# Patient Record
Sex: Female | Born: 1980 | Race: White | Hispanic: No | Marital: Married | Smoking: Current every day smoker
Health system: Southern US, Community
[De-identification: ages and names within clinical notes are randomized; demographics above are authoritative.]

## PROBLEM LIST (undated history)

## (undated) DIAGNOSIS — B1920 Unspecified viral hepatitis C without hepatic coma: Secondary | ICD-10-CM

## (undated) HISTORY — PX: CYSTECTOMY: SUR359

---

## 2005-06-08 ENCOUNTER — Emergency Department (HOSPITAL_COMMUNITY): Admission: EM | Admit: 2005-06-08 | Discharge: 2005-06-08 | Payer: Self-pay | Admitting: Emergency Medicine

## 2007-05-03 ENCOUNTER — Emergency Department (HOSPITAL_COMMUNITY): Admission: EM | Admit: 2007-05-03 | Discharge: 2007-05-03 | Payer: Self-pay | Admitting: Emergency Medicine

## 2009-04-29 ENCOUNTER — Emergency Department (HOSPITAL_COMMUNITY): Admission: EM | Admit: 2009-04-29 | Discharge: 2009-04-29 | Payer: Self-pay | Admitting: Emergency Medicine

## 2010-05-04 ENCOUNTER — Emergency Department (HOSPITAL_COMMUNITY)
Admission: EM | Admit: 2010-05-04 | Discharge: 2010-05-04 | Disposition: A | Discharge status: Home / Self Care | Payer: Medicaid Other | Attending: Emergency Medicine | Admitting: Emergency Medicine

## 2010-05-04 DIAGNOSIS — K089 Disorder of teeth and supporting structures, unspecified: Secondary | ICD-10-CM | POA: Insufficient documentation

## 2010-05-04 DIAGNOSIS — G43909 Migraine, unspecified, not intractable, without status migrainosus: Secondary | ICD-10-CM | POA: Insufficient documentation

## 2010-05-04 DIAGNOSIS — B850 Pediculosis due to Pediculus humanus capitis: Secondary | ICD-10-CM | POA: Insufficient documentation

## 2011-12-06 ENCOUNTER — Other Ambulatory Visit: Payer: Self-pay

## 2011-12-10 ENCOUNTER — Other Ambulatory Visit (HOSPITAL_COMMUNITY): Payer: Self-pay | Admitting: Specialist

## 2011-12-10 DIAGNOSIS — Z3689 Encounter for other specified antenatal screening: Secondary | ICD-10-CM

## 2011-12-12 ENCOUNTER — Encounter (HOSPITAL_COMMUNITY): Payer: Self-pay | Admitting: Specialist

## 2011-12-18 ENCOUNTER — Ambulatory Visit (HOSPITAL_COMMUNITY)
Admission: RE | Admit: 2011-12-18 | Discharge: 2011-12-18 | Disposition: A | Discharge status: Home / Self Care | Payer: Medicaid Other | Source: Ambulatory Visit | Attending: Specialist | Admitting: Specialist

## 2011-12-18 ENCOUNTER — Encounter (HOSPITAL_COMMUNITY): Payer: Self-pay

## 2011-12-18 DIAGNOSIS — O358XX0 Maternal care for other (suspected) fetal abnormality and damage, not applicable or unspecified: Secondary | ICD-10-CM | POA: Insufficient documentation

## 2011-12-18 DIAGNOSIS — F1010 Alcohol abuse, uncomplicated: Secondary | ICD-10-CM | POA: Insufficient documentation

## 2011-12-18 DIAGNOSIS — F1920 Other psychoactive substance dependence, uncomplicated: Secondary | ICD-10-CM | POA: Insufficient documentation

## 2011-12-18 DIAGNOSIS — F1120 Opioid dependence, uncomplicated: Secondary | ICD-10-CM | POA: Insufficient documentation

## 2011-12-18 DIAGNOSIS — O99310 Alcohol use complicating pregnancy, unspecified trimester: Secondary | ICD-10-CM

## 2011-12-18 DIAGNOSIS — Z3689 Encounter for other specified antenatal screening: Secondary | ICD-10-CM

## 2011-12-18 DIAGNOSIS — O99320 Drug use complicating pregnancy, unspecified trimester: Secondary | ICD-10-CM | POA: Insufficient documentation

## 2011-12-18 DIAGNOSIS — F1410 Cocaine abuse, uncomplicated: Secondary | ICD-10-CM | POA: Insufficient documentation

## 2011-12-18 DIAGNOSIS — O99330 Smoking (tobacco) complicating pregnancy, unspecified trimester: Secondary | ICD-10-CM | POA: Insufficient documentation

## 2011-12-18 DIAGNOSIS — O98519 Other viral diseases complicating pregnancy, unspecified trimester: Secondary | ICD-10-CM | POA: Insufficient documentation

## 2011-12-18 DIAGNOSIS — B182 Chronic viral hepatitis C: Secondary | ICD-10-CM | POA: Insufficient documentation

## 2011-12-18 DIAGNOSIS — Z1389 Encounter for screening for other disorder: Secondary | ICD-10-CM | POA: Insufficient documentation

## 2011-12-18 DIAGNOSIS — O98419 Viral hepatitis complicating pregnancy, unspecified trimester: Secondary | ICD-10-CM

## 2011-12-18 DIAGNOSIS — Z363 Encounter for antenatal screening for malformations: Secondary | ICD-10-CM | POA: Insufficient documentation

## 2011-12-18 DIAGNOSIS — B1710 Acute hepatitis C without hepatic coma: Secondary | ICD-10-CM | POA: Insufficient documentation

## 2011-12-18 NOTE — Consult Note (Signed)
MATERNAL FETAL MEDICINE CONSULT  Patient Name: Jill Collier Medical Record Number:  [id number] Date of Birth: 06/30/1980 Requesting Physician Name:  Kent Hjerpe, MD Date of Service: 12/18/2011  Chief Complaint Polysubstance abuse; Hepatitis C  History of Present Illness Jill Collier was seen today for prenatal diagnosis secondary to polysubstance abuse and hepatitis C at the request of Dr. Kent Hjerpe.  The patient is a 31 y.o. G3P2002,at [redacted]w[redacted]d with an EDD of 05/08/2012, by Last Menstrual Period dating method.  Jill Collier reports a prior history of heroine use for which she has been on suboxone maintenance therapy x 1 year.  She reports taking 8 mg TID.  She reports recreational cocaine use with last use 2 weeks ago.  She reports a long history of tobacco abuse and was smoking 1-1/1/2 ppd prior to pregnancy but has decreased to 1/2 ppd at current.  She also was diagnosed this pregnancy with Hepatitis C.  Her husband was recently diagnosed with Hepatitis C in prison. She denies any complaints aside from nausea and fatigue.   Review of Systems Pertinent items are noted in HPI.   Patient History OB History    Grav Para Term Preterm Abortions TAB SAB Ect Mult Living   3 2 2 0 0 0 0 0 0 2     # Outc Date GA Lbr Len/2nd Wgt Sex Del Anes PTL Lv   1 TRM            2 TRM            3 CUR               History reviewed. No pertinent past medical history.  History reviewed. No pertinent past surgical history.  History   Social History  . Marital Status: Married    Spouse Name: N/A    Number of Children: N/A  . Years of Education: N/A   Social History Main Topics  . Smoking status: Current Every Day Smoker -- 0.5 packs/day    Types: Cigarettes  . Smokeless tobacco: Never Used  . Alcohol Use:   . Drug Use: Yes    Special: Cocaine, Heroin  . Sexually Active:    Other Topics Concern  . None   Social History Narrative  . None    History reviewed. No pertinent family history. In  addition, the patient has no family history of mental retardation, birth defects, or genetic diseases.  Physical Examination General appearance - alert, well appearing, and in no distress  Assessment and Recommendations Jill Collier is a 31 yo G3P2002 @ [redacted]w[redacted]d with polysubstance abuse and hepatitis C.    Tobacco use:  Cessation was advised and discussed with patient. She is trying to "cut down". Effects of smoking on fetus and pregnancy were discussed with patient including low birth weight, growth restriction, placental abruption, poor maternal wound healing, increased post partum pulmonary complications, increased neonatal and maternal morbidity, increased SIDS, and association with childhood leukemia. Transdermal or oral nicotine should be considered if the patient desires cessation.   Suboxone use/Cocaine use/EtOH use:  We discussed the implications of substance use and exposure to the fetus. Risks of illicit substance use during pregnancy include but are not limited to: fetal withdrawal syndromes, need for NICU care, preterm delivery, PPROM, and risk of abruption or fetal alcohol syndrome. We also discussed that Suboxone is a new medication and it is a Class C medication now. There are no good data right now regarding the usage of   Suboxone during pregnancy. But it may have side effects including but not limited to intrauterine fetal growth restriction, preterm delivery, PPROM or fetal withdrawal syndromes, need for NICU care. Compared to maternal withdrawal syndromes during pregnancy may have severe side effect on both maternal and fetal health, we would recommend to continue Suboxone maintenance during pregnancy with detox after delivery. We also discussed the detox specialist consult regarding maintenance of Suboxone verse possible switch to Methadone during her pregnancy. Access for signs of withdrawl, especially in the 3rd trimester, as metabolism of the narcotic may be increased and dose may need to  be increased as well.  We also recommended the fetal growth scan every 4 weeks and will start antenatal testing around 30-[redacted] weeks gestational age. Delivery will be planned at a tertiary care center so that NICU will be available and pediatrician familiar with neonatal withdraw will be available as needed.  Due to her polysubstance abuse we advise a Fetal Echo which has been ordered and scheduled at this time.    Hepatitis C is the most common chronic blood borne infection in the [place]  It is a single-stranded RNA virus with transmission similar to that of Hepatitis B.  It is transmitted by infected blood, sexual contact or mother to infant.  Clinical manifestations of liver disease may take 20 years following acute infection.   The incubation period for Hepatitis C infection is 30 to 60 days.  The majority of infections are asymptomatic, but some patients experience fever, fatigue, abdominal discomfort and jaundice.  Chronic infection occurs in 60 - 85% of patients with Hepatitis C.  Of the patients who become chronic carriers, chronic active disease develops in 60 - 70%, and these are at risk for cirrhosis and hepatocellular carcinoma.  Chronic active Hepatitis C has in some studies been associated with intrauterine growth restriction, preterm delivery, and intrahepatic cholestasis of pregnancy.   Transmission rate of infection from mother to infant is 3-6% (as high as 19% for HIV co-infection).  Transmission correlates with maternal viral load.  Mode of delivery does not affect transmission rates.  Patient should be followed with serial growth ultrasounds and evaluated with liver function tests monthly.  A low threshold for evaluation of fasting bile acids if pruritis develops.    Thank you for allowing me the opportunity to take care of your patient.   40 minutes was spent with the patient>50% of which was devoted to face to face counseling.    Tremond Shimabukuro, MD     

## 2011-12-18 NOTE — Progress Notes (Signed)
MATERNAL FETAL MEDICINE CONSULT  Patient Name: Jill Collier Medical Record Number:  [id number] Date of Birth: 1980/05/26 Requesting Physician Name:  Eliseo Squires, MD Date of Service: 12/18/2011  Chief Complaint Polysubstance abuse; Hepatitis C  History of Present Illness Jill Collier was seen today for prenatal diagnosis secondary to polysubstance abuse and hepatitis C at the request of Dr. Eliseo Squires.  The patient is a 31 y.o. G3P2002,at [redacted]w[redacted]d with an EDD of 05/08/2012, by Last Menstrual Period dating method.  [name] reports a prior history of heroine use for which she has been on suboxone maintenance therapy x 1 year.  She reports taking 8 mg TID.  She reports recreational cocaine use with last use 2 weeks ago.  She reports a long history of tobacco abuse and was smoking 1-1/1/2 ppd prior to pregnancy but has decreased to 1/2 ppd at current.  She also was diagnosed this pregnancy with Hepatitis C.  Her husband was recently diagnosed with Hepatitis C in prison. She denies any complaints aside from nausea and fatigue.   Review of Systems Pertinent items are noted in HPI.   Patient History OB History    Grav Para Term Preterm Abortions TAB SAB Ect Mult Living   3 2 2  0 0 0 0 0 0 2     # Outc Date GA Lbr Len/2nd Wgt Sex Del Anes PTL Lv   1 TRM            2 TRM            3 CUR               History reviewed. No pertinent past medical history.  History reviewed. No pertinent past surgical history.  History   Social History  . Marital Status: Married    Spouse Name: N/A    Number of Children: N/A  . Years of Education: N/A   Social History Main Topics  . Smoking status: Current Every Day Smoker -- 0.5 packs/day    Types: Cigarettes  . Smokeless tobacco: Never Used  . Alcohol Use:   . Drug Use: Yes    Special: Cocaine, Heroin  . Sexually Active:    Other Topics Concern  . None   Social History Narrative  . None    History reviewed. No pertinent family history. In  addition, the patient has no family history of mental retardation, birth defects, or genetic diseases.  Physical Examination General appearance - alert, well appearing, and in no distress  Assessment and Recommendations [name] is a 31 yo G3P2002 @ [redacted]w[redacted]d with polysubstance abuse and hepatitis C.    Tobacco use:  Cessation was advised and discussed with patient. She is trying to "cut down". Effects of smoking on fetus and pregnancy were discussed with patient including low birth weight, growth restriction, placental abruption, poor maternal wound healing, increased post partum pulmonary complications, increased neonatal and maternal morbidity, increased SIDS, and association with childhood leukemia. Transdermal or oral nicotine should be considered if the patient desires cessation.   Suboxone use/Cocaine use/EtOH use:  We discussed the implications of substance use and exposure to the fetus. Risks of illicit substance use during pregnancy include but are not limited to: fetal withdrawal syndromes, need for NICU care, preterm delivery, PPROM, and risk of abruption or fetal alcohol syndrome. We also discussed that Suboxone is a new medication and it is a Class C medication now. There are no good data right now regarding the usage of  Suboxone during pregnancy. But it may have side effects including but not limited to intrauterine fetal growth restriction, preterm delivery, PPROM or fetal withdrawal syndromes, need for NICU care. Compared to maternal withdrawal syndromes during pregnancy may have severe side effect on both maternal and fetal health, we would recommend to continue Suboxone maintenance during pregnancy with detox after delivery. We also discussed the detox specialist consult regarding maintenance of Suboxone verse possible switch to Methadone during her pregnancy. Access for signs of withdrawl, especially in the 3rd trimester, as metabolism of the narcotic may be increased and dose may need to  be increased as well.  We also recommended the fetal growth scan every 4 weeks and will start antenatal testing around 30-[redacted] weeks gestational age. Delivery will be planned at a tertiary care center so that NICU will be available and pediatrician familiar with neonatal withdraw will be available as needed.  Due to her polysubstance abuse we advise a Fetal Echo which has been ordered and scheduled at this time.    Hepatitis C is the most common chronic blood borne infection in the [place]  It is a single-stranded RNA virus with transmission similar to that of Hepatitis B.  It is transmitted by infected blood, sexual contact or mother to infant.  Clinical manifestations of liver disease may take 20 years following acute infection.   The incubation period for Hepatitis C infection is 30 to 60 days.  The majority of infections are asymptomatic, but some patients experience fever, fatigue, abdominal discomfort and jaundice.  Chronic infection occurs in 60 - 85% of patients with Hepatitis C.  Of the patients who become chronic carriers, chronic active disease develops in 60 - 70%, and these are at risk for cirrhosis and hepatocellular carcinoma.  Chronic active Hepatitis C has in some studies been associated with intrauterine growth restriction, preterm delivery, and intrahepatic cholestasis of pregnancy.   Transmission rate of infection from mother to infant is 3-6% (as high as 19% for HIV co-infection).  Transmission correlates with maternal viral load.  Mode of delivery does not affect transmission rates.  Patient should be followed with serial growth ultrasounds and evaluated with liver function tests monthly.  A low threshold for evaluation of fasting bile acids if pruritis develops.    Thank you for allowing me the opportunity to take care of your patient.   40 minutes was spent with the patient>50% of which was devoted to face to face counseling.    Molly Maduro, MD

## 2012-02-23 ENCOUNTER — Emergency Department (HOSPITAL_COMMUNITY)
Admission: EM | Admit: 2012-02-23 | Discharge: 2012-02-23 | Discharge status: Against Medical Advice | Payer: Medicaid Other | Attending: Emergency Medicine | Admitting: Emergency Medicine

## 2012-02-23 ENCOUNTER — Encounter (HOSPITAL_COMMUNITY): Payer: Self-pay | Admitting: Emergency Medicine

## 2012-02-23 DIAGNOSIS — O209 Hemorrhage in early pregnancy, unspecified: Secondary | ICD-10-CM | POA: Insufficient documentation

## 2012-02-23 DIAGNOSIS — Z8619 Personal history of other infectious and parasitic diseases: Secondary | ICD-10-CM | POA: Insufficient documentation

## 2012-02-23 DIAGNOSIS — F17200 Nicotine dependence, unspecified, uncomplicated: Secondary | ICD-10-CM | POA: Insufficient documentation

## 2012-02-23 DIAGNOSIS — Z79899 Other long term (current) drug therapy: Secondary | ICD-10-CM | POA: Insufficient documentation

## 2012-02-23 DIAGNOSIS — O4693 Antepartum hemorrhage, unspecified, third trimester: Secondary | ICD-10-CM

## 2012-02-23 HISTORY — DX: Unspecified viral hepatitis C without hepatic coma: B19.20

## 2012-02-23 LAB — URINE MICROSCOPIC-ADD ON

## 2012-02-23 LAB — URINALYSIS, ROUTINE W REFLEX MICROSCOPIC
Nitrite: NEGATIVE
Protein, ur: NEGATIVE mg/dL
Specific Gravity, Urine: 1.01 (ref 1.005–1.030)
Urobilinogen, UA: 2 mg/dL — ABNORMAL HIGH (ref 0.0–1.0)

## 2012-02-23 LAB — CBC: HCT: 32.3 % — ABNORMAL LOW (ref 36.0–46.0)

## 2012-02-23 LAB — ABO/RH: ABO/RH(D): A POS

## 2012-02-23 NOTE — ED Notes (Addendum)
NP at bedside to do pelvic exam.

## 2012-02-23 NOTE — Progress Notes (Signed)
Steward Drone RN called to receive update on Fetal monitoring

## 2012-02-23 NOTE — ED Provider Notes (Signed)
History     CSN: [id number]  Arrival date & time 02/23/12  1925   First MD Initiated Contact with Patient 02/23/12 1933      Chief Complaint  Patient presents with  . Vaginal Bleeding    HPI Jill Collier is a 32 y.o. female G3 P2, SVD x 2 without problems. She is 29.[redacted] weeks gestation who presents to the ED with vaginal bleeding. The bleeding started at midnight last night. She describes the bleeding as less than a period.   Associated symptoms include abdominal pain that has subsided. Denies UTI symptoms, nausea or vomiting or other problems. Last sexual intercourse over 2 months ago. Separated from husband and no other partner since then.  Good fetal movement. Patient states that on ultrasound at [redacted] weeks gestation low lying placenta noted.  Prenatal care at [facility] until recently and decided to change to Dr. Emelda Fear. Next scheduled appointment in 4 days. The history was provided by the patient.    Past Medical History  Diagnosis Date  . Hepatitis C     Past Surgical History  Procedure Laterality Date  . Cystectomy      History reviewed. No pertinent family history.  History  Substance Use Topics  . Smoking status: Current Every Day Smoker -- 0.50 packs/day    Types: Cigarettes  . Smokeless tobacco: Never Used  . Alcohol Use: No    OB History   Grav Para Term Preterm Abortions TAB SAB Ect Mult Living   3 2 2  0 0 0 0 0 0 2      Review of Systems  Constitutional: Negative for fever, chills, diaphoresis and fatigue.  HENT: Negative for ear pain, congestion, sore throat, facial swelling, neck pain, neck stiffness, dental problem and sinus pressure.   Eyes: Negative for photophobia, pain, discharge and visual disturbance.  Respiratory: Negative for cough, chest tightness and wheezing.   Cardiovascular: Negative for chest pain and palpitations.  Gastrointestinal: Negative for nausea, vomiting, abdominal pain, diarrhea, constipation and abdominal distention.   Genitourinary: Positive for vaginal bleeding. Negative for dysuria, urgency, frequency, flank pain, vaginal discharge, difficulty urinating, vaginal pain and pelvic pain.  Musculoskeletal: Negative for myalgias, back pain and gait problem.  Skin: Negative for color change and rash.  Neurological: Negative for dizziness, speech difficulty, weakness, light-headedness, numbness and headaches.  Psychiatric/Behavioral: Negative for confusion and agitation. The patient is not nervous/anxious.     Allergies  Review of patient's allergies indicates no known allergies.  Home Medications   Current Outpatient Rx  Name  Route  Sig  Dispense  Refill  . buprenorphine-naloxone (SUBOXONE) 8-2 MG SUBL   Sublingual   Place 1 tablet under the tongue daily as needed (mood).         . Prenatal Vit-Fe Fumarate-FA (MULTIVITAMIN-PRENATAL) 27-0.8 MG TABS   Oral   Take 1 tablet by mouth daily.           BP 146/79  Pulse 100  Temp(Src) 98.1 F (36.7 C) (Oral)  SpO2 98%  LMP 08/02/2011  Physical Exam  Nursing note and vitals reviewed. Constitutional: She is oriented to person, place, and time. She appears well-developed and well-nourished. No distress.  HENT:  Head: Normocephalic and atraumatic.  Eyes: EOM are normal. Pupils are equal, round, and reactive to light.  Neck: Neck supple.  Cardiovascular: Normal rate and regular rhythm.   Pulmonary/Chest: No respiratory distress. She has no wheezes.  Abdominal: Soft. There is no tenderness.  Gravid consistent with dates.  Genitourinary:  External genitalia without lesions. Moderate blood vaginal vault. Cervix finger tip, thick and high.  Musculoskeletal: Normal range of motion. She exhibits no edema.  Neurological: She is alert and oriented to person, place, and time. No cranial nerve deficit.  Skin: Skin is warm and dry.  Psychiatric: She has a normal mood and affect.   Procedures   Assessment: 32 y.o. female @ 29.[redacted] weeks gestation with  vaginal bleeding  Plan:  Discussed with Dr. Despina Hidden    Transfer to [facility] for monitoring       [place], [place] 02/23/12 2101

## 2012-02-23 NOTE — Progress Notes (Signed)
Received call from [facility] that patient is signing out AMA.

## 2012-02-23 NOTE — ED Provider Notes (Signed)
Patient seen with NP Pt here for vag bleeding.  She reports she is [redacted] weeks pregnant She is in no distress and she denies any abdominal pain. She denies trauma.  No drug use reported Clinically does not appear to be abruption FHT noted Will need consultation with OBGYN.  Will follow closely   Joya Gaskins, MD 02/23/12 2013

## 2012-02-23 NOTE — ED Notes (Signed)
Patient states that her mother has been drinking and that her sister is not allowed by social services to keep the kids. States that she can not get her aunt on the phone and she has not choice but to go home. I explained to the patient that this was not the best idea at this time. She understands the risks of leaving without further evaluation and treatment and has chosen to go home.

## 2012-02-23 NOTE — ED Notes (Signed)
NP at bedside to speak with patient regarding transfer to [facility] hospital tonight. Patient states that she may want to leave and go home to arrange childcare. Patient has been advised by NP and myself that she needs to be transferred for further evaluation and treatment. Patient is on the phone at this time trying to arrange childcare.

## 2012-02-23 NOTE — ED Notes (Signed)
Patient reports is approximately [redacted] weeks pregnant. Reports started bleeding last night after urinating, but bleeding had stopped. Reports bleeding started again today. States bleeding occurs mostly when patient is up and moving. Reports pink-tinged discharge as well.

## 2012-02-23 NOTE — ED Provider Notes (Signed)
Medical screening examination/treatment/procedure(s) were conducted as a shared visit with non-physician practitioner(s) and myself.  I personally evaluated the patient during the encounter   Joya Gaskins, MD 02/23/12 2112

## 2012-02-23 NOTE — Progress Notes (Addendum)
Received call from Lajuana Ripple at [facility] ED about patient.

## 2012-02-25 LAB — URINE CULTURE
Colony Count: NO GROWTH
Culture: NO GROWTH

## 2012-10-22 ENCOUNTER — Encounter (HOSPITAL_COMMUNITY): Payer: Self-pay | Admitting: *Deleted

## 2013-11-16 ENCOUNTER — Encounter (HOSPITAL_COMMUNITY): Payer: Self-pay | Admitting: *Deleted

## 2019-04-10 ENCOUNTER — Emergency Department (HOSPITAL_COMMUNITY)
Admission: EM | Admit: 2019-04-10 | Discharge: 2019-04-10 | Disposition: A | Discharge status: Home / Self Care | Payer: BC Managed Care – PPO | Attending: Emergency Medicine | Admitting: Emergency Medicine

## 2019-04-10 ENCOUNTER — Emergency Department (HOSPITAL_COMMUNITY): Payer: BC Managed Care – PPO

## 2019-04-10 ENCOUNTER — Other Ambulatory Visit: Payer: Self-pay

## 2019-04-10 DIAGNOSIS — R079 Chest pain, unspecified: Secondary | ICD-10-CM | POA: Insufficient documentation

## 2019-04-10 DIAGNOSIS — F17210 Nicotine dependence, cigarettes, uncomplicated: Secondary | ICD-10-CM | POA: Insufficient documentation

## 2019-04-10 DIAGNOSIS — Z79899 Other long term (current) drug therapy: Secondary | ICD-10-CM | POA: Diagnosis not present

## 2019-04-10 LAB — BASIC METABOLIC PANEL
Anion gap: 16 — ABNORMAL HIGH (ref 5–15)
BUN: 13 mg/dL (ref 6–20)
CO2: 22 mmol/L (ref 22–32)
Calcium: 9.1 mg/dL (ref 8.9–10.3)
Chloride: 100 mmol/L (ref 98–111)
Creatinine, Ser: 0.71 mg/dL (ref 0.44–1.00)
GFR calc Af Amer: >60 mL/min (ref >60)
GFR calc non Af Amer: >60 mL/min (ref >60)
Glucose, Bld: 105 mg/dL — ABNORMAL HIGH (ref 70–99)
Potassium: 3.7 mmol/L (ref 3.5–5.1)
Sodium: 138 mmol/L (ref 135–145)

## 2019-04-10 LAB — HEPATIC FUNCTION PANEL
ALT: 14 U/L (ref 0–44)
AST: 19 U/L (ref 15–41)
Albumin: 4.1 g/dL (ref 3.5–5.0)
Alkaline Phosphatase: 68 U/L (ref 38–126)
Bilirubin, Direct: <0.1 mg/dL (ref 0.0–0.2)
Total Bilirubin: 0.5 mg/dL (ref 0.3–1.2)
Total Protein: 7.3 g/dL (ref 6.5–8.1)

## 2019-04-10 LAB — I-STAT BETA HCG BLOOD, ED (MC, WL, AP ONLY): I-stat hCG, quantitative: <5 m[IU]/mL (ref <5)

## 2019-04-10 LAB — TROPONIN I (HIGH SENSITIVITY)
Troponin I (High Sensitivity): <2 ng/L (ref <18)
Troponin I (High Sensitivity): <2 ng/L (ref <18)

## 2019-04-10 LAB — D-DIMER, QUANTITATIVE: D-Dimer, Quant: 0.3 ug/mL-FEU (ref 0.00–0.50)

## 2019-04-10 LAB — CBC
HCT: 40.6 % (ref 36.0–46.0)
Hemoglobin: 13 g/dL (ref 12.0–15.0)
MCH: 29.9 pg (ref 26.0–34.0)
MCHC: 32 g/dL (ref 30.0–36.0)
MCV: 93.3 fL (ref 80.0–100.0)
Platelets: 424 10*3/uL — ABNORMAL HIGH (ref 150–400)
RBC: 4.35 MIL/uL (ref 3.87–5.11)
RDW: 13.6 % (ref 11.5–15.5)
WBC: 11.2 10*3/uL — ABNORMAL HIGH (ref 4.0–10.5)
nRBC: 0 % (ref 0.0–0.2)

## 2019-04-10 LAB — LIPASE, BLOOD: Lipase: 26 U/L (ref 11–51)

## 2019-04-10 MED ORDER — SODIUM CHLORIDE 0.9% FLUSH: 3.0000 mL | Freq: Once | INTRAVENOUS | Status: DC

## 2019-04-10 NOTE — ED Notes (Signed)
Pt returned from XR at this time. Placed pt back on monitor, pt placed in position of comfort and advised of wait status.   

## 2019-04-10 NOTE — ED Notes (Signed)
Pt altered nurse of sharp pain returning, EKG obtained and vitals taken. Dr Rush Landmark made aware.

## 2019-04-10 NOTE — ED Triage Notes (Signed)
Patient stated sudden onset of stabbing pain through chest and back while at work, denies PMH.

## 2019-04-10 NOTE — Discharge Instructions (Signed)
Your work-up today was overall reassuring.  As we discussed, I suspect your symptoms are more related to stress and anxiety and panic attacks and a problem with your heart or lungs.  Your labs and imaging were overall reassuring.  Given your period of observation with no concerning findings, we feel you are safe for discharge home.  Please follow-up with a primary doctor for further management.  If any symptoms change or worsen acutely, please return to nearest emergency department.  Please rest and stay hydrated.

## 2019-04-10 NOTE — ED Notes (Signed)
Pt transported to XR via stretcher at this time.   

## 2019-04-10 NOTE — ED Provider Notes (Signed)
[facility] EMERGENCY DEPARTMENT Provider Note   CSN: [id number] Arrival date & time: 04/10/19  9983     History Chief Complaint  Patient presents with  . Chest Pain    Jill Collier is a 39 y.o. female.  The history is provided by the patient and medical records. No language interpreter was used.  Chest Pain Pain location:  Substernal area Pain quality: pressure and sharp   Pain radiates to:  Upper back Pain severity:  Moderate Onset quality:  Gradual Duration:  3 hours Timing:  Constant Progression:  Resolved Chronicity:  New Worsened by:  Deep breathing Ineffective treatments:  None tried Associated symptoms: anxiety, back pain, fatigue and shortness of breath   Associated symptoms: no abdominal pain, no altered mental status, no cough, no diaphoresis, no dizziness, no fever, no headache, no lower extremity edema, no nausea, no near-syncope, no numbness, no palpitations, no vomiting and no weakness   Risk factors: obesity and smoking   Risk factors: not female and no prior DVT/PE        Past Medical History:  Diagnosis Date  . Hepatitis C     Patient Active Problem List   Diagnosis Date Noted  . Alcohol abuse complicating pregnancy 12/18/2011  . Cocaine abuse complicating pregnancy (HCC) 12/18/2011  . Suboxone maintenance treatment complicating pregnancy, antepartum (HCC) 12/18/2011  . Tobacco smoking complicating pregnancy 12/18/2011  . Acute hepatitis C complicating pregnancy, antepartum 12/18/2011    Past Surgical History:  Procedure Laterality Date  . CYSTECTOMY       OB History    Gravida  3   Para  2   Term  2   Preterm  0   AB  0   Living  2     SAB  0   TAB  0   Ectopic  0   Multiple  0   Live Births              No family history on file.  Social History   Tobacco Use  . Smoking status: Current Every Day Smoker    Packs/day: 0.50    Types: Cigarettes  . Smokeless tobacco: Never Used  Substance  Use Topics  . Alcohol use: No  . Drug use: Yes    Types: Cocaine, Heroin    Home Medications Prior to Admission medications   Medication Sig Start Date End Date Taking? Authorizing Provider  buprenorphine-naloxone (SUBOXONE) 8-2 MG SUBL Place 1 tablet under the tongue daily as needed (mood).    [provider]  Prenatal Vit-Fe Fumarate-FA (MULTIVITAMIN-PRENATAL) 27-0.8 MG TABS Take 1 tablet by mouth daily.    [provider]    Allergies    Patient has no known allergies.  Review of Systems   Review of Systems  Constitutional: Positive for fatigue. Negative for chills, diaphoresis and fever.  HENT: Negative for congestion.   Eyes: Negative for visual disturbance.  Respiratory: Positive for shortness of breath. Negative for cough, chest tightness and wheezing.   Cardiovascular: Positive for chest pain. Negative for palpitations and near-syncope.  Gastrointestinal: Negative for abdominal pain, constipation, diarrhea, nausea and vomiting.  Genitourinary: Negative for dysuria, flank pain and frequency.  Musculoskeletal: Positive for back pain. Negative for neck pain and neck stiffness.  Skin: Negative for rash and wound.  Neurological: Negative for dizziness, weakness, light-headedness, numbness and headaches.  Psychiatric/Behavioral: Negative for agitation and confusion.  All other systems reviewed and are negative.   Physical Exam Updated Vital  Signs Ht 5\' 3"  (1.6 m)   Wt 78.9 kg   BMI 30.82 kg/m   Physical Exam Vitals and nursing note reviewed.  Constitutional:      General: She is not in acute distress.    Appearance: She is well-developed. She is not ill-appearing, toxic-appearing or diaphoretic.  HENT:     Head: Normocephalic and atraumatic.     Right Ear: External ear normal.     Left Ear: External ear normal.     Nose: Nose normal.     Mouth/Throat:     Pharynx: No oropharyngeal exudate.  Eyes:     Conjunctiva/sclera: Conjunctivae normal.      Pupils: Pupils are equal, round, and reactive to light.  Cardiovascular:     Rate and Rhythm: Normal rate.     Heart sounds: Normal heart sounds. No murmur.  Pulmonary:     Effort: Pulmonary effort is normal. No tachypnea or respiratory distress.     Breath sounds: No stridor. No decreased breath sounds, wheezing, rhonchi or rales.  Chest:     Chest wall: No tenderness.  Abdominal:     General: There is no distension.     Tenderness: There is no abdominal tenderness. There is no rebound.  Musculoskeletal:     Cervical back: Normal range of motion and neck supple.     Right lower leg: No tenderness. No edema.     Left lower leg: No tenderness. No edema.  Skin:    General: Skin is warm.     Findings: No erythema or rash.  Neurological:     General: No focal deficit present.     Mental Status: She is alert and oriented to person, place, and time.     Motor: No abnormal muscle tone.     Coordination: Coordination normal.     Deep Tendon Reflexes: Reflexes are normal and symmetric.  Psychiatric:        Mood and Affect: Mood is anxious.     ED Results / Procedures / Treatments   Labs (all labs ordered are listed, but only abnormal results are displayed) Labs Reviewed  BASIC METABOLIC PANEL - Abnormal; Notable for the following components:      Result Value   Glucose, Bld 105 (*)    Anion gap 16 (*)    All other components within normal limits  CBC - Abnormal; Notable for the following components:   WBC 11.2 (*)    Platelets 424 (*)    All other components within normal limits  D-DIMER, QUANTITATIVE (NOT AT [facility])  HEPATIC FUNCTION PANEL  LIPASE, BLOOD  I-STAT BETA HCG BLOOD, ED ([facility], [facility], [facility] ONLY)  TROPONIN I (HIGH SENSITIVITY)  TROPONIN I (HIGH SENSITIVITY)    EKG None ED ECG REPORT   Date: 04/10/2019  Rate: 82  Rhythm: normal sinus rhythm  QRS Axis: normal  Intervals: PR shortened  ST/T Wave abnormalities: normal  Conduction Disutrbances:none  Narrative  Interpretation:   Old EKG Reviewed: none available and unchanged  I have personally reviewed the EKG tracing and agree with the computerized printout as noted.    Radiology DG Chest 2 View  Result Date: 04/10/2019 CLINICAL DATA:  04/12/2019 mid chest pain shortness of breath since this morning EXAM: CHEST - 2 VIEW COMPARISON:  None FINDINGS: Normal heart size, mediastinal contours, and pulmonary vascularity. Minimal subsegmental atelectasis at LEFT base. Lungs otherwise clear. No pulmonary infiltrate, pleural effusion or pneumothorax. Osseous structures unremarkable. IMPRESSION: Minimal LEFT basilar atelectasis. Electronically Signed  By: Ulyses Southward M.D.   On: 04/10/2019 08:36    Procedures Procedures (including critical care time)  Medications Ordered in ED Medications  sodium chloride flush (NS) 0.9 % injection 3 mL (has no administration in time range)    ED Course  I have reviewed the triage vital signs and the nursing notes.  Pertinent labs & imaging results that were available during my care of the patient were reviewed by me and considered in my medical decision making (see chart for details).    MDM Rules/Calculators/A&P                      Jill Collier is a 39 y.o. female with a past medical history significant for prior hepatitis C, prior cholecystectomy, and remote history of polysubstance abuse who presents with chest pain and shortness of breath.  Patient reports that she was at work this morning at around 7 AM she started having chest tightness and shortness of breath.  She reports that she also started having a sharp chest pain and chest pressure radiating around towards her back.  She reports it was slightly pleuritic.  She felt lightheaded but had no nausea, vomiting, or syncope.  She also felt anxious and that she may be having a panic attack.  She feels that she has had some of the pains with panic attacks in the past but felt this was slightly different.  She denies  leg pain or leg swelling.  No history of DVT or PE.  No recent medication changes.  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  She denies fevers, chills, congestion, or cough.  She reports chronic sinus troubles but no recent changes.  No abdominal pain.  No nausea, vomiting, urinary symptoms or GI symptoms.  No other sick contacts.  No other complaints.  She rubs the discomfort was 8 out 10 severity but is currently improved.  She reports took her blood pressure and it was elevated at home before coming to the emergency department however in the emergency department her blood pressure is in the 120s on my evaluation.  On exam, she has good pulses in all extremities.  No lower extremity tenderness or swelling seen.  Lungs are clear and chest is nontender.  Cannot reproduce her discomfort.  No murmur.  No significant rhonchi.  Abdomen nontender.  Patient otherwise resting comfortably now in no distress.  Vital signs reassuring.  EKG shows no STEMI.  Heart score calculated as a 2.  Clinically I suspect symptoms are more related to anxiety and panic attack however she does report a family history of some heart disease so we will get cardiac enzymes as well as a D-dimer given the pleuritic component of the shortness breath and chest discomfort that wrapped towards her back.  Given her reassuring blood pressure this time, low suspicion for an aortic or dissection etiology with no history of hypertension.  Given the lack of recent IV drug use, and no murmur on my exam, low suspicion for an aortic cause of symptoms at this time.  Anticipate reassessment after work-up.  Work-up was very reassuring.  Patient thinks her symptoms are more related to anxiety and stress.  She started feeling anxious and the pain returned then when she calmed down it resolved.  Troponin negative x2.  D-dimer negative.  Chest x-ray and the labs otherwise reassuring.  Patient encouraged to follow-up  with a PCP for further  management of likely stress anxiety causing chest discomfort.  Do not feel she has a cardiac or pulmonary etiology at this time.  Patient agreed with plan of care and was discharged in good condition with understanding return precautions and follow-up instructions.   Final Clinical Impression(s) / ED Diagnoses Final diagnoses:  Chest pain, unspecified type    Rx / DC Orders ED Discharge Orders    None      Clinical Impression: 1. Chest pain, unspecified type     Disposition: Discharge  Condition: Good  I have discussed the results, Dx and Tx plan with the pt(& family if present). He/she/they expressed understanding and agree(s) with the plan. Discharge instructions discussed at great length. Strict return precautions discussed and pt &/or family have verbalized understanding of the instructions. No further questions at time of discharge.    New Prescriptions   No medications on file    Follow Up: [place] [address] [place] [id number] [phone] Schedule an appointment as soon as possible for a visit    [place] [address] [id number] [place]       Kierston Plasencia, Gwenyth Allegra, MD 04/10/19 1345

## 2020-11-25 ENCOUNTER — Other Ambulatory Visit: Payer: Self-pay

## 2020-11-25 ENCOUNTER — Encounter (HOSPITAL_COMMUNITY): Payer: Self-pay

## 2020-11-25 ENCOUNTER — Emergency Department (HOSPITAL_COMMUNITY)
Admission: EM | Admit: 2020-11-25 | Discharge: 2020-11-25 | Disposition: A | Discharge status: Home / Self Care | Payer: BC Managed Care – PPO | Attending: Emergency Medicine | Admitting: Emergency Medicine

## 2020-11-25 DIAGNOSIS — K029 Dental caries, unspecified: Secondary | ICD-10-CM | POA: Insufficient documentation

## 2020-11-25 DIAGNOSIS — K0381 Cracked tooth: Secondary | ICD-10-CM | POA: Insufficient documentation

## 2020-11-25 DIAGNOSIS — F17210 Nicotine dependence, cigarettes, uncomplicated: Secondary | ICD-10-CM | POA: Insufficient documentation

## 2020-11-25 DIAGNOSIS — K0889 Other specified disorders of teeth and supporting structures: Secondary | ICD-10-CM

## 2020-11-25 MED ORDER — PENICILLIN V POTASSIUM 500 MG PO TABS
500.0000 mg | ORAL_TABLET | Freq: Once | ORAL | Status: AC
  Administered 2020-11-25: 500 mg via ORAL
  Filled 2020-11-25: qty 1

## 2020-11-25 MED ORDER — OXYCODONE HCL 5 MG PO TABS
5.0000 mg | ORAL_TABLET | Freq: Once | ORAL | Status: AC
  Administered 2020-11-25: 5 mg via ORAL
  Filled 2020-11-25: qty 1

## 2020-11-25 MED ORDER — CHLORHEXIDINE GLUCONATE 0.12 % MT SOLN: 10.0000 mL | Freq: Two times a day (BID) | OROMUCOSAL | 0 refills | Status: AC

## 2020-11-25 MED ORDER — PENICILLIN V POTASSIUM 500 MG PO TABS: 500.0000 mg | ORAL_TABLET | Freq: Four times a day (QID) | ORAL | 0 refills | Status: AC

## 2020-11-25 MED ORDER — IBUPROFEN 200 MG PO TABS
600.0000 mg | ORAL_TABLET | Freq: Once | ORAL | Status: AC
  Administered 2020-11-25: 600 mg via ORAL
  Filled 2020-11-25: qty 3

## 2020-11-25 NOTE — ED Provider Notes (Signed)
[facility]-EMERGENCY DEPT Provider Note   CSN: [id number] Arrival date & time: 11/25/20  0854     History Chief Complaint  Patient presents with   Dental Pain         Jill Collier is a 40 y.o. female present emerged part of dental pain.  She reports that she felt she had an abscess in the right upper side of her mouth that spontaneously drained 3 days ago, with relief of her swelling.  However the pain has resumed and is now also on the left lower side of her mouth.  She feels that her face is swelling.  She feels she has an infection needs antibiotics.  She is currently seeing a dentist for her multiple dental caries and poor dentition, but cannot afford tooth extraction.  HPI     Past Medical History:  Diagnosis Date   Hepatitis C     Patient Active Problem List   Diagnosis Date Noted   Alcohol abuse complicating pregnancy 12/18/2011   Cocaine abuse complicating pregnancy (HCC) 12/18/2011   Suboxone maintenance treatment complicating pregnancy, antepartum (HCC) 12/18/2011   Tobacco smoking complicating pregnancy 12/18/2011   Acute hepatitis C complicating pregnancy, antepartum 12/18/2011    Past Surgical History:  Procedure Laterality Date   CYSTECTOMY       OB History     Gravida  3   Para  2   Term  2   Preterm  0   AB  0   Living  2      SAB  0   IAB  0   Ectopic  0   Multiple  0   Live Births              History reviewed. No pertinent family history.  Social History   Tobacco Use   Smoking status: Every Day    Packs/day: 0.50    Types: Cigarettes   Smokeless tobacco: Never  Substance Use Topics   Alcohol use: No   Drug use: Yes    Types: Cocaine, Heroin    Home Medications Prior to Admission medications   Medication Sig Start Date End Date Taking? Authorizing Provider  chlorhexidine (PERIDEX) 0.12 % solution Use as directed 10 mLs in the mouth or throat 2 (two) times daily for 10 days. 11/25/20  12/05/20 Yes Dorita Rowlands, Kermit Balo, MD  penicillin v potassium (VEETID) 500 MG tablet Take 1 tablet (500 mg total) by mouth 4 (four) times daily for 7 days. 11/25/20 12/02/20 Yes Omar Orrego, Kermit Balo, MD  dextromethorphan-guaiFENesin [facility] DM) 30-600 MG 12hr tablet Take 1 tablet by mouth 2 (two) times daily.    [provider]    Allergies    Patient has no known allergies.  Review of Systems   Review of Systems  Constitutional:  Negative for chills and fever.  HENT:  Positive for dental problem and trouble swallowing.   Gastrointestinal:  Negative for vomiting.  Skin:  Negative for color change and rash.  All other systems reviewed and are negative.  Physical Exam Updated Vital Signs BP (!) 151/111 (BP Location: Right Arm)   Pulse (!) 102   Temp 98.4 F (36.9 C) (Oral)   Resp 18   SpO2 99%   Physical Exam Constitutional:      General: She is not in acute distress. HENT:     Head: Normocephalic and atraumatic.     Comments: Extremely poor dentitia, multiple dental fractures, no palpable abscess on exam Eyes:  Conjunctiva/sclera: Conjunctivae normal.     Pupils: Pupils are equal, round, and reactive to light.  Cardiovascular:     Rate and Rhythm: Normal rate and regular rhythm.  Pulmonary:     Effort: Pulmonary effort is normal. No respiratory distress.  Skin:    General: Skin is warm and dry.  Neurological:     Mental Status: She is alert.  Psychiatric:        Mood and Affect: Mood normal.        Behavior: Behavior normal.    ED Results / Procedures / Treatments   Labs (all labs ordered are listed, but only abnormal results are displayed) Labs Reviewed - No data to display  EKG None  Radiology No results found.  Procedures Procedures   Medications Ordered in ED Medications  penicillin v potassium (VEETID) tablet 500 mg (has no administration in time range)  oxyCODONE (Oxy IR/ROXICODONE) immediate release tablet 5 mg (has no administration in  time range)  ibuprofen (ADVIL) tablet 600 mg (has no administration in time range)    ED Course  I have reviewed the triage vital signs and the nursing notes.  Pertinent labs & imaging results that were available during my care of the patient were reviewed by me and considered in my medical decision making (see chart for details).  Patient is here with multiple dental caries, extremely poor dentition.  She will need to be started on antibiotics.  Penicillin ordered.  I do not see evidence of dental abscess that is drainable here in the ED.  She has no signs or symptoms of Ludwick's angina.  I doubt deep space neck infection.  She does not appear toxic.  Her airway is patent.  No stridor.  Okay for follow-up as an outpatient with a dentist.     Final Clinical Impression(s) / ED Diagnoses Final diagnoses:  Pain, dental    Rx / DC Orders ED Discharge Orders          Ordered    penicillin v potassium (VEETID) 500 MG tablet  4 times daily        11/25/20 0924    chlorhexidine (PERIDEX) 0.12 % solution  2 times daily        11/25/20 0924             Terald Sleeper, MD 11/25/20 [phone]

## 2020-11-25 NOTE — ED Triage Notes (Signed)
Pt reports dental pain that began about 3 days ago that is now radiating to her face.

## 2020-12-31 ENCOUNTER — Encounter (HOSPITAL_COMMUNITY): Payer: Self-pay

## 2020-12-31 ENCOUNTER — Other Ambulatory Visit: Payer: Self-pay

## 2020-12-31 ENCOUNTER — Emergency Department (HOSPITAL_COMMUNITY)
Admission: EM | Admit: 2020-12-31 | Discharge: 2020-12-31 | Disposition: A | Discharge status: Home / Self Care | Payer: Medicaid Other | Attending: Emergency Medicine | Admitting: Emergency Medicine

## 2020-12-31 ENCOUNTER — Emergency Department (HOSPITAL_COMMUNITY): Payer: Medicaid Other

## 2020-12-31 DIAGNOSIS — F17210 Nicotine dependence, cigarettes, uncomplicated: Secondary | ICD-10-CM | POA: Insufficient documentation

## 2020-12-31 DIAGNOSIS — R519 Headache, unspecified: Secondary | ICD-10-CM | POA: Insufficient documentation

## 2020-12-31 DIAGNOSIS — R42 Dizziness and giddiness: Secondary | ICD-10-CM | POA: Insufficient documentation

## 2020-12-31 DIAGNOSIS — Z20822 Contact with and (suspected) exposure to covid-19: Secondary | ICD-10-CM | POA: Insufficient documentation

## 2020-12-31 DIAGNOSIS — R112 Nausea with vomiting, unspecified: Secondary | ICD-10-CM | POA: Insufficient documentation

## 2020-12-31 DIAGNOSIS — H9313 Tinnitus, bilateral: Secondary | ICD-10-CM | POA: Insufficient documentation

## 2020-12-31 DIAGNOSIS — R0789 Other chest pain: Secondary | ICD-10-CM | POA: Insufficient documentation

## 2020-12-31 LAB — BASIC METABOLIC PANEL
Anion gap: 9 (ref 5–15)
BUN: 17 mg/dL (ref 6–20)
CO2: 20 mmol/L — ABNORMAL LOW (ref 22–32)
Calcium: 9.2 mg/dL (ref 8.9–10.3)
Chloride: 107 mmol/L (ref 98–111)
Creatinine, Ser: 0.73 mg/dL (ref 0.44–1.00)
GFR, Estimated: >60 mL/min (ref >60)
Glucose, Bld: 124 mg/dL — ABNORMAL HIGH (ref 70–99)
Potassium: 3.7 mmol/L (ref 3.5–5.1)
Sodium: 136 mmol/L (ref 135–145)

## 2020-12-31 LAB — RESP PANEL BY RT-PCR (FLU A&B, COVID) ARPGX2
Influenza A by PCR: NEGATIVE
Influenza B by PCR: NEGATIVE
SARS Coronavirus 2 by RT PCR: NEGATIVE

## 2020-12-31 LAB — CBC
HCT: 42.3 % (ref 36.0–46.0)
Hemoglobin: 14 g/dL (ref 12.0–15.0)
MCH: 30.2 pg (ref 26.0–34.0)
MCHC: 33.1 g/dL (ref 30.0–36.0)
MCV: 91.4 fL (ref 80.0–100.0)
Platelets: 387 10*3/uL (ref 150–400)
RBC: 4.63 MIL/uL (ref 3.87–5.11)
RDW: 12.8 % (ref 11.5–15.5)
WBC: 9.1 10*3/uL (ref 4.0–10.5)
nRBC: 0 % (ref 0.0–0.2)

## 2020-12-31 LAB — TROPONIN I (HIGH SENSITIVITY)
Troponin I (High Sensitivity): <2 ng/L (ref <18)
Troponin I (High Sensitivity): <2 ng/L (ref <18)

## 2020-12-31 LAB — I-STAT BETA HCG BLOOD, ED (MC, WL, AP ONLY): I-stat hCG, quantitative: <5 m[IU]/mL (ref <5)

## 2020-12-31 MED ORDER — DEXAMETHASONE SODIUM PHOSPHATE 10 MG/ML IJ SOLN
10.0000 mg | Freq: Once | INTRAMUSCULAR | Status: AC
  Administered 2020-12-31: 10 mg via INTRAVENOUS
  Filled 2020-12-31: qty 1

## 2020-12-31 MED ORDER — DIPHENHYDRAMINE HCL 25 MG PO CAPS
50.0000 mg | ORAL_CAPSULE | Freq: Once | ORAL | Status: AC
  Administered 2020-12-31: 50 mg via ORAL
  Filled 2020-12-31: qty 2

## 2020-12-31 MED ORDER — PROCHLORPERAZINE EDISYLATE 10 MG/2ML IJ SOLN
10.0000 mg | Freq: Once | INTRAMUSCULAR | Status: AC
  Administered 2020-12-31: 10 mg via INTRAVENOUS
  Filled 2020-12-31: qty 2

## 2020-12-31 NOTE — Discharge Instructions (Addendum)
You were given medications for your headache. I'm glad that you are feeling improved. I suspect that your ear pain is related to your dental pain. I would encourage you to follow up with a dentist. It would also be good to find a primary care provider to follow up on your blood pressure as well as help you with health maintenance tests such as screening for diabetes, doing Pap smears, etc.  Return for worsening headaches, weakness, changes in your speech or vision, blood in your stool, or any other symptoms concerning to you.   Taking daily medications can actually cause overuse headaches. Taking daily Ibuprofen can also put you at risk for developing ulcers and gastrointestinal bleeding. I would encourage you to try to hydrate with water more, ensure you are getting adequate sleep, limiting TV/screen time. Exercise also helps with headaches and overall health. You can take Tylenol for your pain when your headache is very severe. I would recommend that you not take BC powder as well as this can also be harmful to your kidneys.

## 2020-12-31 NOTE — ED Triage Notes (Addendum)
Pt c/o headache, dizziness, and chest discomfort for over 1 week.   Reports she also has a dental abscess she has been treated for

## 2020-12-31 NOTE — ED Provider Notes (Signed)
[facility]-EMERGENCY DEPT Provider Note   CSN: [id number] Arrival date & time: 12/31/20  1851     History Chief Complaint  Patient presents with   Headache   Chest Pain   Dizziness    Jill Collier is a 40 y.o. female.  Jill Collier is a 40 y.o. 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  She has associated dizziness, nausea, vomiting, light sensitivity. States that she has a history of migraines but that this feels atypical for her as her migraines are typically all over.  States that in the last 9 days she had 1 day of relief about 3 days ago before the headache returned.  She typically takes ibuprofen daily about 1-3 times a day, 800 mg at a time.  She does not always take this with food.  She has no other daily medications.  States that her family advised her to take her blood pressures at home and they ranged 150-160 systolic over low 100s diastolic. She has no history of HTN, also denies having PCP Reports that her water intake is typically poor but she has been trying to do better lately.  She has no phonophobia, and denies any fall or trauma history, no weakness.  Denies any double vision but does endorse some visual changes stating "I see a little negative".   Jill Collier also complains of bilateral ear ringing that started last night while she was at work.  She has not had any fevers.  She has a history of dental abscesses and states that she recently completed completed a 7-day course of penicillin for this.   She also notes some intermittent central, stabbing/pressure sensation in her chest.  Nothing makes this better and nothing makes this worse.  She has not tried anything for this.  She denies any shortness of breath, abdominal pain, diarrhea, constipation, urinary symptoms.       Past  Medical History:  Diagnosis Date   Hepatitis C     Patient Active Problem List   Diagnosis Date Noted   Alcohol abuse complicating pregnancy 12/18/2011   Cocaine abuse complicating pregnancy (HCC) 12/18/2011   Suboxone maintenance treatment complicating pregnancy, antepartum (HCC) 12/18/2011   Tobacco smoking complicating pregnancy 12/18/2011   Acute hepatitis C complicating pregnancy, antepartum 12/18/2011    Past Surgical History:  Procedure Laterality Date   CYSTECTOMY       OB History     Gravida  3   Para  2   Term  2   Preterm  0   AB  0   Living  2      SAB  0   IAB  0   Ectopic  0   Multiple  0   Live Births              No family history on file.  Social History   Tobacco Use   Smoking status: Every Day    Packs/day: 0.50    Types: Cigarettes   Smokeless tobacco: Never  Substance Use Topics   Alcohol use: No   Drug use: Not Currently    Types: Cocaine, Heroin    Home Medications Prior to Admission medications   Medication Sig Start Date End Date Taking? Authorizing Provider  dextromethorphan-guaiFENesin [facility] DM) 30-600 MG 12hr  tablet Take 1 tablet by mouth 2 (two) times daily.    [provider]    Allergies    Patient has no known allergies.  Review of Systems   Review of Systems  Constitutional:  Negative for fever.  HENT:  Positive for dental problem and sore throat. Negative for congestion, rhinorrhea and sneezing.   Eyes:  Positive for photophobia and visual disturbance.  Respiratory:  Negative for shortness of breath.   Cardiovascular:  Positive for chest pain.  Gastrointestinal:  Negative for abdominal pain, constipation, diarrhea and vomiting.  Genitourinary:  Negative for dysuria and frequency.  Musculoskeletal:  Positive for neck pain. Negative for gait problem.  Neurological:  Positive for dizziness and headaches. Negative for syncope, weakness and numbness.   Physical Exam Updated Vital Signs BP  (!) 148/86    Pulse 73    Temp 98.5 F (36.9 C) (Oral)    Resp 18    Ht 5\' 3"  (1.6 m)    Wt 95.3 kg    LMP 12/19/2020    SpO2 98%    BMI 37.20 kg/m   Physical Exam Constitutional:      General: She is not in acute distress.    Appearance: She is obese. She is not ill-appearing.  HENT:     Head: Normocephalic.     Mouth/Throat:     Dentition: Abnormal dentition.     Comments: Poor dentition with multiple caries and missing teeth. Tongue piercing.  Eyes:     General: No scleral icterus.    Extraocular Movements: Extraocular movements intact.     Right eye: No nystagmus.     Left eye: No nystagmus.     Pupils: Pupils are equal, round, and reactive to light.  Cardiovascular:     Rate and Rhythm: Normal rate and regular rhythm.     Heart sounds: Normal heart sounds. No murmur heard. Pulmonary:     Effort: Pulmonary effort is normal. No respiratory distress.     Breath sounds: Normal breath sounds. No wheezing.  Abdominal:     General: Bowel sounds are normal.     Palpations: Abdomen is soft. There is no mass.     Comments: Mild tenderness to LLQ and LUQ without R/G   Musculoskeletal:        General: No swelling. Normal range of motion.     Cervical back: Normal range of motion and neck supple. No rigidity.  Lymphadenopathy:     Cervical: No cervical adenopathy.  Skin:    General: Skin is warm.     Capillary Refill: Capillary refill takes less than 2 seconds.  Neurological:     Mental Status: She is alert.     GCS: GCS eye subscore is 4. GCS verbal subscore is 5. GCS motor subscore is 6.     Cranial Nerves: No cranial nerve deficit, dysarthria or facial asymmetry.     Sensory: No sensory deficit.     Motor: No weakness.  Psychiatric:        Mood and Affect: Mood normal.        Behavior: Behavior normal.    ED Results / Procedures / Treatments   Labs (all labs ordered are listed, but only abnormal results are displayed) Labs Reviewed  BASIC METABOLIC PANEL - Abnormal;  Notable for the following components:      Result Value   CO2 20 (*)    Glucose, Bld 124 (*)    All other components within normal limits  RESP  PANEL BY RT-PCR (FLU A&B, COVID) ARPGX2  CBC  RAPID URINE DRUG SCREEN, HOSP PERFORMED  I-STAT BETA HCG BLOOD, ED ([facility], [facility], [facility] ONLY)  TROPONIN I (HIGH SENSITIVITY)  TROPONIN I (HIGH SENSITIVITY)    EKG EKG Interpretation  Date/Time:  Saturday December 31 2020 19:05:47 EST Ventricular Rate:  127 PR Interval:  106 QRS Duration: 85 QT Interval:  362 QTC Calculation: 527 R Axis:   57 Text Interpretation: Sinus tachycardia Borderline repolarization abnormality Prolonged QT interval Artifact in lead(s) I II aVR Abnormal ECG Confirmed by Carmin Muskrat [phone]) on 12/31/2020 9:40:52 PM  Radiology DG Chest Port 1 View  Result Date: 12/31/2020 CLINICAL DATA:  Chest pain for over 1 week. EXAM: PORTABLE CHEST 1 VIEW COMPARISON:  Chest radiograph 04/10/2019 FINDINGS: Of note, the patient's chin overlies the lung apices. The heart size and mediastinal contours are within normal limits. Bibasilar subsegmental atelectasis. No pleural effusion or pneumothorax. The visualized skeletal structures are unremarkable. IMPRESSION: Bibasilar subsegmental atelectasis. Electronically Signed   By: Ileana Roup M.D.   On: 12/31/2020 19:31    Procedures Procedures   Medications Ordered in ED Medications  prochlorperazine (COMPAZINE) injection 10 mg (has no administration in time range)  diphenhydrAMINE (BENADRYL) capsule 50 mg (50 mg Oral Given 12/31/20 2220)  dexamethasone (DECADRON) injection 10 mg (10 mg Intravenous Given 12/31/20 2222)    ED Course  I have reviewed the triage vital signs and the nursing notes.  Pertinent labs & imaging results that were available during my care of the patient were reviewed by me and considered in my medical decision making (see chart for details).   MDM Rules/Calculators/A&P                         MKYA VOGAN is a 40  y.o. female who presents with complaints of 9 days of ongoing headache. Symptoms not completely consistent with migraine as pain is bilateral- but she does have history of migraines in the past. Concerning that she has been taking Ibuprofen daily. Reassuringly, Hgb 14, Cr 0.73 and electrolytes are unremarkable. Vitals are notable for elevated BP up to 154/100 on arrival (now improved to 148/86). Examination was notable for LLQ and LUQ pain on palpation but patient denies any abdominal pain otherwise and did not seem concerned about the pain with palpation. No change in bowel habits or blood in her stools. Neuro examination was normal, no concern for any intracranial pathologies/CVA at this time.   CXR with bibasilar subsegmental atelectasis. EKG without ST changes, Trop <2. I doubt that her chest pain is cardiac in origin. More likely to anxiety (has had anxiety attacks present this way in the past) or reflux- she is obese.   Suspect that her ear ringing is likely referred pain from her dental caries. She has poor dentition with multiple caries and missing teeth and recently finished 7-day PCN course. She would benefit from seeing a dentist outpatient.   Plan is to give migraine cocktail with Decadron, Compazine and Benadryl and reassess. Will hold off any any further imaging. Suspect she will be able to d/c home if she is feeling improved.   Patient signed out to Dr. Vanita Panda.    Final Clinical Impression(s) / ED Diagnoses Final diagnoses:  None    Rx / DC Orders ED Discharge Orders     None        Sharion Settler, DO 12/31/20 2228    Carmin Muskrat, MD 12/31/20 2300

## 2020-12-31 NOTE — ED Notes (Signed)
Blue and gold top save tubes sent to lab.

## 2020-12-31 NOTE — ED Provider Notes (Signed)
Emergency Medicine Provider Triage Evaluation Note  KEIERA STRATHMAN , a 40 y.o. female  was evaluated in triage.  Pt complains of chest pain, headache, dental pain.   Review of Systems  Positive: chest pain, headache, dental pain.  Negative: Sob, cough  Physical Exam  BP (!) 154/100 (BP Location: Left Arm)    Pulse (!) 126    Temp 98.1 F (36.7 C) (Oral)    Resp 19    SpO2 100%  Gen:   Awake, no distress   Resp:  Normal effort  MSK:   Moves extremities without difficulty  Other:  tachycardic  Medical Decision Making  Medically screening exam initiated at 7:02 PM.  Appropriate orders placed.  JAMESIA LINNEN was informed that the remainder of the evaluation will be completed by another provider, this initial triage assessment does not replace that evaluation, and the importance of remaining in the ED until their evaluation is complete.     Rayne Du 12/31/20 1902    Gerhard Munch, MD 12/31/20 2223

## 2021-01-24 ENCOUNTER — Encounter (HOSPITAL_BASED_OUTPATIENT_CLINIC_OR_DEPARTMENT_OTHER): Payer: Self-pay | Admitting: *Deleted

## 2021-01-24 ENCOUNTER — Other Ambulatory Visit: Payer: Self-pay

## 2021-01-24 ENCOUNTER — Emergency Department (HOSPITAL_BASED_OUTPATIENT_CLINIC_OR_DEPARTMENT_OTHER)
Admission: EM | Admit: 2021-01-24 | Discharge: 2021-01-24 | Disposition: A | Discharge status: Home / Self Care | Payer: Medicaid Other | Attending: Emergency Medicine | Admitting: Emergency Medicine

## 2021-01-24 DIAGNOSIS — K047 Periapical abscess without sinus: Secondary | ICD-10-CM | POA: Insufficient documentation

## 2021-01-24 MED ORDER — BENZOCAINE 20 % MT AERO
INHALATION_SPRAY | Freq: Once | OROMUCOSAL | Status: AC
  Filled 2021-01-24: qty 57

## 2021-01-24 MED ORDER — KETOROLAC TROMETHAMINE 15 MG/ML IJ SOLN
15.0000 mg | Freq: Once | INTRAMUSCULAR | Status: AC
Start: 2021-01-24 — End: 2021-01-24
  Administered 2021-01-24: 15 mg via INTRAMUSCULAR
  Filled 2021-01-24: qty 1

## 2021-01-24 MED ORDER — BUPIVACAINE-EPINEPHRINE (PF) 0.5% -1:200000 IJ SOLN
1.8000 mL | Freq: Once | INTRAMUSCULAR | Status: AC
  Administered 2021-01-24: 1.8 mL
  Filled 2021-01-24: qty 1.8

## 2021-01-24 MED ORDER — ACETAMINOPHEN 500 MG PO TABS
1000.0000 mg | ORAL_TABLET | Freq: Once | ORAL | Status: AC
  Administered 2021-01-24: 1000 mg via ORAL
  Filled 2021-01-24: qty 2

## 2021-01-24 MED ORDER — CLINDAMYCIN HCL 300 MG PO CAPS: 300.0000 mg | ORAL_CAPSULE | Freq: Three times a day (TID) | ORAL | 0 refills | Status: AC

## 2021-01-24 MED ORDER — CLINDAMYCIN HCL 150 MG PO CAPS
450.0000 mg | ORAL_CAPSULE | Freq: Once | ORAL | Status: AC
  Administered 2021-01-24: 450 mg via ORAL
  Filled 2021-01-24: qty 3

## 2021-01-24 NOTE — ED Provider Notes (Signed)
[facility] EMERGENCY DEPARTMENT Provider Note   CSN: [id number] Arrival date & time: 01/24/21  1745     History  Chief Complaint  Patient presents with   Oral Swelling    Jill Collier is a 41 y.o. female.  41 yo F with chief complaints of right-sided facial swelling.  This is a recurrent problem for her.  She has poor dentition at baseline and is told that she needs all of her teeth removed.  She unfortunately does not have the funds to have this completed.  She was seen in the emergency department 2 months ago for the same.  Was started on antibiotics had improvement in the she stopped her antibiotics it reoccurred.  No fevers that she is noted at home.  They will tolerate by mouth without issue.  The history is provided by the patient.  Illness Severity:  Moderate Onset quality:  Gradual Duration:  2 days Timing:  Constant Progression:  Worsening Chronicity:  New Associated symptoms: no chest pain, no congestion, no fever, no headaches, no myalgias, no nausea, no rhinorrhea, no shortness of breath, no vomiting and no wheezing       Home Medications Prior to Admission medications   Medication Sig Start Date End Date Taking? Authorizing Provider  clindamycin (CLEOCIN) 300 MG capsule Take 1 capsule (300 mg total) by mouth 3 (three) times daily for 7 days. 01/24/21 01/31/21 Yes Melene Plan, DO  dextromethorphan-guaiFENesin [facility] DM) 30-600 MG 12hr tablet Take 1 tablet by mouth 2 (two) times daily.    [provider]      Allergies    Patient has no known allergies.    Review of Systems   Review of Systems  Constitutional:  Negative for chills and fever.  HENT:  Positive for facial swelling. Negative for congestion and rhinorrhea.   Eyes:  Negative for redness and visual disturbance.  Respiratory:  Negative for shortness of breath and wheezing.   Cardiovascular:  Negative for chest pain and palpitations.  Gastrointestinal:  Negative for nausea and  vomiting.  Genitourinary:  Negative for dysuria and urgency.  Musculoskeletal:  Negative for arthralgias and myalgias.  Skin:  Negative for pallor and wound.  Neurological:  Negative for dizziness and headaches.   Physical Exam Updated Vital Signs BP (!) 146/93 (BP Location: Right Arm)    Pulse 100    Temp 98.6 F (37 C) (Oral)    Resp 20    Ht 5\' 3"  (1.6 m)    Wt 95.3 kg    LMP 01/15/2021    SpO2 97%    BMI 37.22 kg/m  Physical Exam Vitals and nursing note reviewed.  Constitutional:      General: She is not in acute distress.    Appearance: She is well-developed. She is not diaphoretic.  HENT:     Head: Normocephalic and atraumatic.     Comments: Right maxillary swelling.  Poor dentition diffusely.  Area of fluctuance noted adjacent to the right upper canine.  Eyes:     Pupils: Pupils are equal, round, and reactive to light.  Cardiovascular:     Rate and Rhythm: Normal rate and regular rhythm.     Heart sounds: No murmur heard.   No friction rub. No gallop.  Pulmonary:     Effort: Pulmonary effort is normal.     Breath sounds: No wheezing or rales.  Abdominal:     General: There is no distension.     Palpations: Abdomen is soft.  Tenderness: There is no abdominal tenderness.  Musculoskeletal:        General: No tenderness.     Cervical back: Normal range of motion and neck supple.  Skin:    General: Skin is warm and dry.  Neurological:     Mental Status: She is alert and oriented to person, place, and time.  Psychiatric:        Behavior: Behavior normal.    ED Results / Procedures / Treatments   Labs (all labs ordered are listed, but only abnormal results are displayed) Labs Reviewed - No data to display  EKG None  Radiology No results found.  Procedures Dental Block  Date/Time: 01/24/2021 6:42 PM Performed by: Deno Etienne, DO Authorized by: Deno Etienne, DO   Consent:    Consent obtained:  Verbal   Consent given by:  Patient   Risks, benefits, and  alternatives were discussed: yes     Risks discussed:  Allergic reaction, infection, nerve damage and pain   Alternatives discussed:  No treatment, delayed treatment and alternative treatment Universal protocol:    Procedure explained and questions answered to patient or proxy's satisfaction: yes     Immediately prior to procedure, a time out was called: yes     Patient identity confirmed:  Verbally with patient Indications:    Indications: dental abscess   Location:    Anesthesia block type: right inferior orbital. Procedure details:    Topical anesthetic:  Benzocaine spray   Syringe type:  Aspirating dental syringe   Needle gauge:  27 G   Anesthetic injected:  Bupivacaine 0.5% WITH epi   Injection procedure:  Anatomic landmarks identified, anatomic landmarks palpated, introduced needle and negative aspiration for blood Post-procedure details:    Outcome:  Pain improved   Procedure completion:  Tolerated well, no immediate complications .Marland KitchenIncision and Drainage  Date/Time: 01/24/2021 6:43 PM Performed by: Deno Etienne, DO Authorized by: Deno Etienne, DO   Consent:    Consent obtained:  Verbal   Consent given by:  Patient and spouse   Risks, benefits, and alternatives were discussed: yes     Risks discussed:  Bleeding, damage to other organs, incomplete drainage and infection   Alternatives discussed:  Delayed treatment, alternative treatment and no treatment Universal protocol:    Procedure explained and questions answered to patient or proxy's satisfaction: yes     Immediately prior to procedure, a time out was called: yes     Patient identity confirmed:  Verbally with patient Location:    Type:  Abscess   Location:  Mouth   Mouth location: Periapical about the R upper canine. Pre-procedure details:    Skin preparation:  Antiseptic wash Sedation:    Sedation type:  None Anesthesia:    Anesthesia method:  Nerve block Procedure type:    Complexity:  Simple Procedure details:     Ultrasound guidance: no     Needle aspiration: no     Incision types:  Stab incision   Incision depth:  Submucosal   Drainage:  Bloody and purulent   Drainage amount:  Scant   Wound treatment:  Wound left open   Packing materials:  None Post-procedure details:    Procedure completion:  Tolerated well, no immediate complications    Medications Ordered in ED Medications  bupivacaine-epinephrine (MARCAINE W/ EPI) 0.5% -1:200000 injection 1.8 mL (1.8 mLs Infiltration Given 01/24/21 1828)  Benzocaine (HURRCAINE) 20 % mouth spray ( Mouth/Throat Given 01/24/21 1828)  ketorolac (TORADOL) 15 MG/ML injection 15 mg (  15 mg Intramuscular Given 01/24/21 1827)  acetaminophen (TYLENOL) tablet 1,000 mg (1,000 mg Oral Given 01/24/21 1822)  clindamycin (CLEOCIN) capsule 450 mg (450 mg Oral Given 01/24/21 1823)    ED Course/ Medical Decision Making/ A&P                           Medical Decision Making  41 yo F with a chief complaints of facial swelling and dental pain.  I discussed risk and benefits of performing a dental block and incision and drainage.  Patient is electing for procedure. .  Had some small amount of purulence removed.  Patient feeling better.  Dentistry follow-up.  6:45 PM:  I have discussed the diagnosis/risks/treatment options with the patient and family and believe the pt to be eligible for discharge home to follow-up with Dentistry. We also discussed returning to the ED immediately if new or worsening sx occur. We discussed the sx which are most concerning (e.g., sudden worsening pain, fever, inability to tolerate by mouth) that necessitate immediate return. Medications administered to the patient during their visit and any new prescriptions provided to the patient are listed below.  Medications given during this visit Medications  bupivacaine-epinephrine (MARCAINE W/ EPI) 0.5% -1:200000 injection 1.8 mL (1.8 mLs Infiltration Given 01/24/21 1828)  Benzocaine (HURRCAINE) 20 % mouth  spray ( Mouth/Throat Given 01/24/21 1828)  ketorolac (TORADOL) 15 MG/ML injection 15 mg (15 mg Intramuscular Given 01/24/21 1827)  acetaminophen (TYLENOL) tablet 1,000 mg (1,000 mg Oral Given 01/24/21 1822)  clindamycin (CLEOCIN) capsule 450 mg (450 mg Oral Given 01/24/21 1823)     The patient appears reasonably screen and/or stabilized for discharge and I doubt any other medical condition or other [facility] requiring further screening, evaluation, or treatment in the ED at this time prior to discharge.          Final Clinical Impression(s) / ED Diagnoses Final diagnoses:  Dental abscess    Rx / DC Orders ED Discharge Orders          Ordered    clindamycin (CLEOCIN) 300 MG capsule  3 times daily        01/24/21 1837              Deno Etienne, DO 01/24/21 1845

## 2021-01-24 NOTE — ED Triage Notes (Signed)
Swelling to the right side of her face. She was treated for dental abscess 2 weeks ago.

## 2021-01-24 NOTE — Discharge Instructions (Signed)
Take 4 over the counter ibuprofen tablets 3 times a day or 2 over-the-counter naproxen tablets twice a day for pain. Also take tylenol 1000mg (2 extra strength) four times a day.    Return for worsening swelling, fever

## 2021-11-17 ENCOUNTER — Emergency Department (HOSPITAL_BASED_OUTPATIENT_CLINIC_OR_DEPARTMENT_OTHER)
Admission: EM | Admit: 2021-11-17 | Discharge: 2021-11-17 | Disposition: A | Discharge status: Home / Self Care | Payer: Self-pay | Attending: Emergency Medicine | Admitting: Emergency Medicine

## 2021-11-17 ENCOUNTER — Encounter (HOSPITAL_BASED_OUTPATIENT_CLINIC_OR_DEPARTMENT_OTHER): Payer: Self-pay | Admitting: Emergency Medicine

## 2021-11-17 ENCOUNTER — Other Ambulatory Visit: Payer: Self-pay

## 2021-11-17 ENCOUNTER — Emergency Department (HOSPITAL_BASED_OUTPATIENT_CLINIC_OR_DEPARTMENT_OTHER): Payer: Self-pay

## 2021-11-17 DIAGNOSIS — F17200 Nicotine dependence, unspecified, uncomplicated: Secondary | ICD-10-CM | POA: Insufficient documentation

## 2021-11-17 DIAGNOSIS — Z716 Tobacco abuse counseling: Secondary | ICD-10-CM | POA: Insufficient documentation

## 2021-11-17 DIAGNOSIS — Z20822 Contact with and (suspected) exposure to covid-19: Secondary | ICD-10-CM | POA: Insufficient documentation

## 2021-11-17 DIAGNOSIS — R000 Tachycardia, unspecified: Secondary | ICD-10-CM | POA: Insufficient documentation

## 2021-11-17 DIAGNOSIS — J209 Acute bronchitis, unspecified: Secondary | ICD-10-CM | POA: Insufficient documentation

## 2021-11-17 LAB — RESP PANEL BY RT-PCR (FLU A&B, COVID) ARPGX2
Influenza A by PCR: NEGATIVE
Influenza B by PCR: NEGATIVE
SARS Coronavirus 2 by RT PCR: NEGATIVE

## 2021-11-17 MED ORDER — AZITHROMYCIN 250 MG PO TABS
500.0000 mg | ORAL_TABLET | Freq: Once | ORAL | Status: AC
  Administered 2021-11-17: 500 mg via ORAL
  Filled 2021-11-17: qty 2

## 2021-11-17 MED ORDER — PREDNISONE 20 MG PO TABS: 60.0000 mg | ORAL_TABLET | Freq: Every day | ORAL | 0 refills | Status: AC

## 2021-11-17 MED ORDER — NICOTINE 21 MG/24HR TD PT24: 21.0000 mg | MEDICATED_PATCH | Freq: Every day | TRANSDERMAL | 3 refills | Status: AC

## 2021-11-17 MED ORDER — ALBUTEROL SULFATE HFA 108 (90 BASE) MCG/ACT IN AERS
2.0000 | INHALATION_SPRAY | Freq: Once | RESPIRATORY_TRACT | Status: AC
  Administered 2021-11-17: 2 via RESPIRATORY_TRACT
  Filled 2021-11-17: qty 6.7

## 2021-11-17 MED ORDER — PREDNISONE 50 MG PO TABS
60.0000 mg | ORAL_TABLET | Freq: Once | ORAL | Status: AC
  Administered 2021-11-17: 60 mg via ORAL
  Filled 2021-11-17: qty 1

## 2021-11-17 MED ORDER — AZITHROMYCIN 250 MG PO TABS: 250.0000 mg | ORAL_TABLET | Freq: Every day | ORAL | 0 refills | Status: AC

## 2021-11-17 NOTE — ED Triage Notes (Signed)
Strong, productive cough, intermittent nasal congestion, sore throat x 4 days.

## 2021-11-17 NOTE — ED Provider Notes (Signed)
[facility] [facility] EMERGENCY DEPARTMENT Provider Note   CSN: [id number] Arrival date & time: 11/17/21  1754     History {Add pertinent medical, surgical, social history, OB history to HPI:1} Chief Complaint  Patient presents with   Cough    Jill Collier is a 41 y.o. female.  She has no significant past medical history.  She is complaining of a cough that started couple of days ago productive of minimal sputum but associated with some nasal congestion.  No known fevers.  She is a smoker.  No sick contacts but she works at [employer].  The history is provided by the patient.  Cough Cough characteristics:  Productive Sputum characteristics:  Nondescript Severity:  Moderate Onset quality:  Gradual Duration:  2 days Timing:  Intermittent Progression:  Unchanged Chronicity:  New Relieved by:  None tried Worsened by:  Deep breathing Ineffective treatments:  None tried Associated symptoms: chest pain, rhinorrhea, shortness of breath and sore throat   Associated symptoms: no fever        Home Medications Prior to Admission medications   Medication Sig Start Date End Date Taking? Authorizing Provider  dextromethorphan-guaiFENesin (MUCINEX DM) 30-600 MG 12hr tablet Take 1 tablet by mouth 2 (two) times daily.    [provider]      Allergies    Patient has no known allergies.    Review of Systems   Review of Systems  Constitutional:  Negative for fever.  HENT:  Positive for rhinorrhea and sore throat.   Eyes:  Negative for visual disturbance.  Respiratory:  Positive for cough and shortness of breath.   Cardiovascular:  Positive for chest pain.    Physical Exam Updated Vital Signs BP (!) 151/134   Pulse (!) 122   Temp 98.9 F (37.2 C) (Oral)   Resp 20   Ht 5\' 3"  (1.6 m)   Wt 93 kg   LMP 11/10/2021   SpO2 99%   BMI 36.31 kg/m  Physical Exam Vitals and nursing note reviewed.  Constitutional:      General: She is not in acute distress.     Appearance: Normal appearance. She is well-developed.  HENT:     Head: Normocephalic and atraumatic.     Right Ear: Tympanic membrane normal.     Left Ear: Tympanic membrane normal.     Mouth/Throat:     Mouth: Mucous membranes are moist.     Pharynx: Oropharynx is clear. Posterior oropharyngeal erythema present. No oropharyngeal exudate.  Eyes:     Conjunctiva/sclera: Conjunctivae normal.  Cardiovascular:     Rate and Rhythm: Regular rhythm. Tachycardia present.     Heart sounds: No murmur heard. Pulmonary:     Effort: Pulmonary effort is normal. No respiratory distress.     Breath sounds: Normal breath sounds.  Abdominal:     Palpations: Abdomen is soft.     Tenderness: There is no abdominal tenderness.  Musculoskeletal:     Cervical back: Neck supple.     Right lower leg: No edema.     Left lower leg: No edema.  Skin:    General: Skin is warm and dry.     Capillary Refill: Capillary refill takes less than 2 seconds.  Neurological:     Mental Status: She is alert.     ED Results / Procedures / Treatments   Labs (all labs ordered are listed, but only abnormal results are displayed) Labs Reviewed  RESP PANEL BY RT-PCR (FLU A&B, COVID) ARPGX2  EKG None  Radiology DG Chest 2 View  Result Date: 11/17/2021 CLINICAL DATA:  Cough, sore throat EXAM: CHEST - 2 VIEW COMPARISON:  12/31/2020 FINDINGS: Lungs are clear.  No pleural effusion or pneumothorax. The heart is normal in size. Visualized osseous structures are within normal limits. IMPRESSION: Normal chest radiographs. Electronically Signed   By: Charline Bills M.D.   On: 11/17/2021 18:23    Procedures Procedures  {Document cardiac monitor, telemetry assessment procedure when appropriate:1}  Medications Ordered in ED Medications  azithromycin (ZITHROMAX) tablet 500 mg (has no administration in time range)  predniSONE (DELTASONE) tablet 60 mg (has no administration in time range)  albuterol (VENTOLIN HFA) 108 (90  Base) MCG/ACT inhaler 2 puff (has no administration in time range)    ED Course/ Medical Decision Making/ A&P                           Medical Decision Making Amount and/or Complexity of Data Reviewed Radiology: ordered.  Risk Prescription drug management.  Jill Collier was evaluated in Emergency Department on 11/17/2021 for the symptoms described in the history of present illness. She was evaluated in the context of the global COVID-19 pandemic, which necessitated consideration that the patient might be at risk for infection with the SARS-CoV-2 virus that causes COVID-19. Institutional protocols and algorithms that pertain to the evaluation of patients at risk for COVID-19 are in a state of rapid change based on information released by regulatory bodies including the CDC and federal and state organizations. These policies and algorithms were followed during the patient's care in the ED. This patient complains of ***; this involves an extensive number of treatment Options and is a complaint that carries with it a high risk of complications and morbidity. The differential includes ***  I ordered, reviewed and interpreted labs, which included *** I ordered medication *** and reviewed PMP when indicated. I ordered imaging studies which included *** and I independently    visualized and interpreted imaging which showed *** Additional history obtained from *** Previous records obtained and reviewed *** I consulted *** and discussed lab and imaging findings and discussed disposition.  Cardiac monitoring reviewed, *** Social determinants considered, *** Critical Interventions: ***  After the interventions stated above, I reevaluated the patient and found *** Admission and further testing considered, ***    {Document critical care time when appropriate:1} {Document review of labs and clinical decision tools ie heart score, Chads2Vasc2 etc:1}  {Document your independent review of  radiology images, and any outside records:1} {Document your discussion with family members, caretakers, and with consultants:1} {Document social determinants of health affecting pt's care:1} {Document your decision making why or why not admission, treatments were needed:1} Final Clinical Impression(s) / ED Diagnoses Final diagnoses:  None    Rx / DC Orders ED Discharge Orders     None

## 2021-11-17 NOTE — Discharge Instructions (Signed)
You were seen in the emergency department for cough.  Your chest x-ray COVID and flu test were negative.  We are treating you with antibiotics and steroids for acute bronchitis.  We are also prescribing you a nicotine patch.  Please use the inhaler 2 puffs every 4 hours as needed.  Drink plenty of fluids.  Return to the emergency department if any worsening or concerning symptoms.

## 2021-11-17 NOTE — ED Notes (Signed)
Coughing for 4 days. Denies any fevers today.Coughing up phlegm.Denies shortness of breath

## 2022-06-05 DIAGNOSIS — F330 Major depressive disorder, recurrent, mild: Secondary | ICD-10-CM | POA: Diagnosis not present

## 2022-06-05 DIAGNOSIS — F4322 Adjustment disorder with anxiety: Secondary | ICD-10-CM | POA: Diagnosis not present

## 2022-06-16 DIAGNOSIS — Z6830 Body mass index (BMI) 30.0-30.9, adult: Secondary | ICD-10-CM | POA: Diagnosis not present

## 2022-06-16 DIAGNOSIS — T63481A Toxic effect of venom of other arthropod, accidental (unintentional), initial encounter: Secondary | ICD-10-CM | POA: Diagnosis not present

## 2022-06-24 DIAGNOSIS — S70361A Insect bite (nonvenomous), right thigh, initial encounter: Secondary | ICD-10-CM | POA: Diagnosis not present

## 2022-11-09 IMAGING — DX DG CHEST 1V PORT
1 series · 1 of 1 positions shown · non-contrast
Comparison: Chest radiograph 04/10/2019

CLINICAL DATA: Chest pain for over 1 week.

EXAM:
PORTABLE CHEST 1 VIEW

[chest ap]
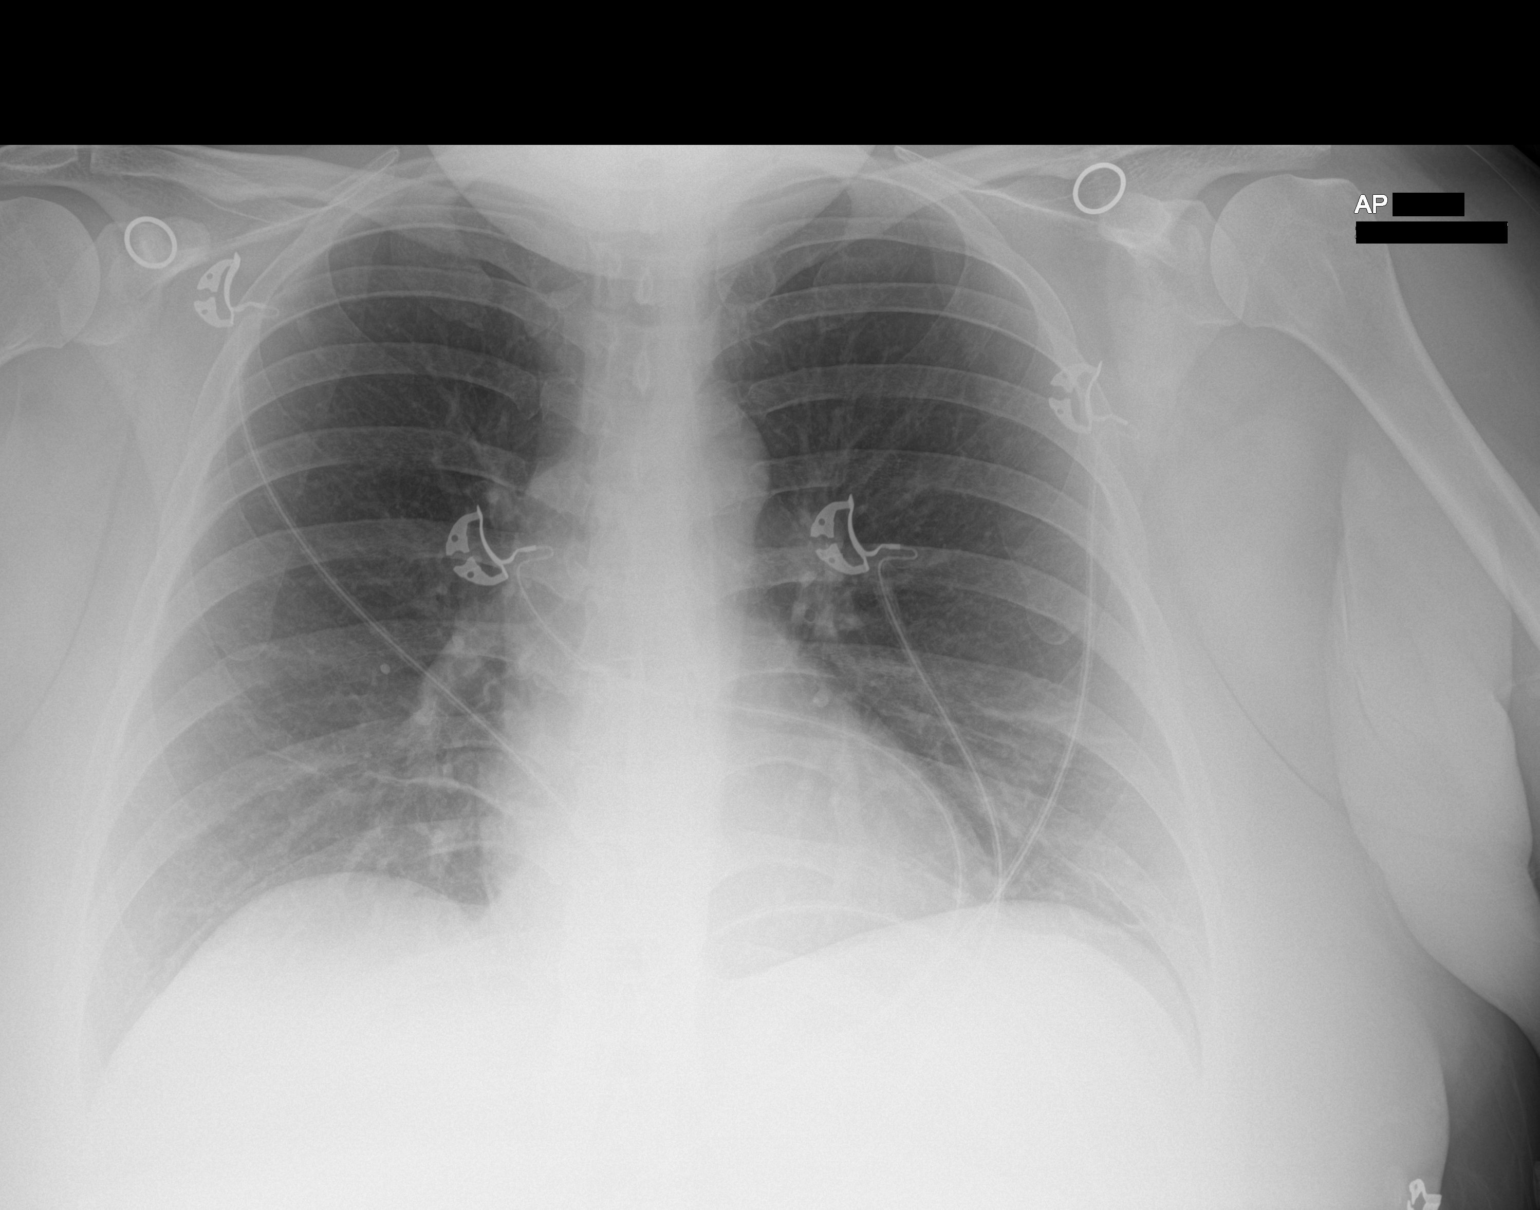

[1 of 1 positions shown; findings below may reference images not displayed]

FINDINGS: Of note, the patient's chin overlies the lung apices. The heart size
and mediastinal contours are within normal limits. Bibasilar
subsegmental atelectasis. No pleural effusion or pneumothorax. The
visualized skeletal structures are unremarkable.
IMPRESSION: Bibasilar subsegmental atelectasis.

## 2023-06-27 DIAGNOSIS — Z419 Encounter for procedure for purposes other than remedying health state, unspecified: Secondary | ICD-10-CM | POA: Diagnosis not present

## 2023-07-27 DIAGNOSIS — Z419 Encounter for procedure for purposes other than remedying health state, unspecified: Secondary | ICD-10-CM | POA: Diagnosis not present

## 2023-07-30 ENCOUNTER — Emergency Department (HOSPITAL_BASED_OUTPATIENT_CLINIC_OR_DEPARTMENT_OTHER)
Admission: EM | Admit: 2023-07-30 | Discharge: 2023-07-30 | Disposition: A | Discharge status: Home / Self Care | Attending: Emergency Medicine | Admitting: Emergency Medicine

## 2023-07-30 ENCOUNTER — Encounter (HOSPITAL_BASED_OUTPATIENT_CLINIC_OR_DEPARTMENT_OTHER): Payer: Self-pay

## 2023-07-30 ENCOUNTER — Other Ambulatory Visit: Payer: Self-pay

## 2023-07-30 DIAGNOSIS — W25XXXA Contact with sharp glass, initial encounter: Secondary | ICD-10-CM | POA: Insufficient documentation

## 2023-07-30 DIAGNOSIS — S51812A Laceration without foreign body of left forearm, initial encounter: Secondary | ICD-10-CM | POA: Insufficient documentation

## 2023-07-30 DIAGNOSIS — Z23 Encounter for immunization: Secondary | ICD-10-CM | POA: Diagnosis not present

## 2023-07-30 MED ORDER — OXYCODONE-ACETAMINOPHEN 5-325 MG PO TABS: 1.0000 | ORAL_TABLET | Freq: Four times a day (QID) | ORAL | 0 refills | Status: AC | PRN

## 2023-07-30 MED ORDER — LIDOCAINE-EPINEPHRINE (PF) 2 %-1:200000 IJ SOLN
INTRAMUSCULAR | Status: AC
  Administered 2023-07-30: 20 mL
  Filled 2023-07-30: qty 20

## 2023-07-30 MED ORDER — TETANUS-DIPHTH-ACELL PERTUSSIS 5-2.5-18.5 LF-MCG/0.5 IM SUSY
0.5000 mL | PREFILLED_SYRINGE | Freq: Once | INTRAMUSCULAR | Status: AC
  Administered 2023-07-30: 0.5 mL via INTRAMUSCULAR
  Filled 2023-07-30: qty 0.5

## 2023-07-30 MED ORDER — CEPHALEXIN 500 MG PO CAPS: 500.0000 mg | ORAL_CAPSULE | Freq: Four times a day (QID) | ORAL | 0 refills | Status: AC

## 2023-07-30 MED ORDER — CEPHALEXIN 250 MG PO CAPS
500.0000 mg | ORAL_CAPSULE | Freq: Once | ORAL | Status: AC
  Administered 2023-07-30: 500 mg via ORAL
  Filled 2023-07-30: qty 2

## 2023-07-30 MED ORDER — OXYCODONE-ACETAMINOPHEN 5-325 MG PO TABS
2.0000 | ORAL_TABLET | Freq: Once | ORAL | Status: AC
  Administered 2023-07-30: 2 via ORAL
  Filled 2023-07-30: qty 2

## 2023-07-30 MED ORDER — CEPHALEXIN 500 MG PO CAPS: 500.0000 mg | ORAL_CAPSULE | Freq: Four times a day (QID) | ORAL | 0 refills | Status: DC

## 2023-07-30 MED ORDER — OXYCODONE-ACETAMINOPHEN 5-325 MG PO TABS: 1.0000 | ORAL_TABLET | Freq: Four times a day (QID) | ORAL | 0 refills | Status: DC | PRN

## 2023-07-30 NOTE — ED Triage Notes (Signed)
 Pt POV after punching window in anger, open wound noted to L forearm, actively bleeding.

## 2023-07-30 NOTE — ED Notes (Signed)
 Dr Geroldine @ Bedside

## 2023-07-30 NOTE — ED Provider Notes (Signed)
 [facility] EMERGENCY DEPARTMENT AT [facility] Provider Note   CSN: [id number] Arrival date & time: 07/30/23  9890     Patient presents with: No chief complaint on file.   Jill Collier is a 43 y.o. female.   Patient is a 43 year old female presenting with a left forearm laceration.  She punched a glass door which shattered and lacerated her arm.  She has a large laceration to the volar aspect of the forearm.    Last tetanus 10 years ago.       Prior to Admission medications   Medication Sig Start Date End Date Taking? Authorizing Provider  azithromycin  (ZITHROMAX ) 250 MG tablet Take 1 tablet (250 mg total) by mouth daily. 11/17/21   Towana Ozell BROCKS, MD  dextromethorphan-guaiFENesin [facility] DM) 30-600 MG 12hr tablet Take 1 tablet by mouth 2 (two) times daily.    [provider]  nicotine  (NICODERM CQ  - DOSED IN MG/24 HOURS) 21 mg/24hr patch Place 1 patch (21 mg total) onto the skin daily. 11/17/21   Towana Ozell BROCKS, MD  predniSONE  (DELTASONE ) 20 MG tablet Take 3 tablets (60 mg total) by mouth daily. 11/17/21   Butler, Michael C, MD    Allergies: Patient has no known allergies.    Review of Systems  All other systems reviewed and are negative.   Updated Vital Signs BP (!) 128/115   Pulse (!) 128   Temp 98 F (36.7 C) (Oral)   Resp 20   SpO2 99%   Physical Exam Vitals and nursing note reviewed.  Constitutional:      Appearance: Normal appearance.  HENT:     Head: Normocephalic.  Pulmonary:     Effort: Pulmonary effort is normal.  Musculoskeletal:     Comments: There is a large laceration noted to the volar aspect of the left forearm measuring 12 cm in total length.  There appears to be a laceration through one of the muscle bellies, but no obvious tendon involvement.  Ulnar and radial pulses are palpable.  She is able to flex and extend her finger and wrist.  Skin:    General: Skin is warm and dry.  Neurological:     Mental Status: She is  alert and oriented to person, place, and time.        (all labs ordered are listed, but only abnormal results are displayed) Labs Reviewed - No data to display  EKG: None  Radiology: No results found.   Procedures   Medications Ordered in the ED  lidocaine -EPINEPHrine  (XYLOCAINE  W/EPI) 2 %-1:200000 (PF) injection (20 mLs  Given 07/30/23 0138)  Tdap (BOOSTRIX ) injection 0.5 mL (0.5 mLs Intramuscular Given 07/30/23 0144)   LACERATION REPAIR Performed by: Vicenta Able Authorized by: Vicenta Able Consent: Verbal consent obtained. Risks and benefits: risks, benefits and alternatives were discussed Consent given by: patient Patient identity confirmed: provided demographic data Prepped and Draped in normal sterile fashion Wound explored  Laceration Location: left forearm  Laceration Length: 12cm  No Foreign Bodies seen or palpated  Anesthesia: local infiltration  Local anesthetic: lidocaine  1% without epinephrine   Anesthetic total: 10 ml  Irrigation method: syringe Amount of cleaning: standard  Skin closure: 3-0 ethilon  Number of sutures: 24  Technique: simple interrupted  Patient tolerance: Patient tolerated the procedure well with no immediate complications.  Medical Decision Making Risk Prescription drug management.   Patient presenting with a large, deep laceration to the left forearm as described and pictured above.  Repair as above.  Following the repair, she did experience swelling of the soft tissues in the area of the wound and a compressive dressing was placed.  Care was discussed with Dr. Alyse from Hand Surgery who will follow her up Wednesday morning.  Tdap updated.  Pain meds and antibiotics given.     Final diagnoses:  None    ED Discharge Orders     None          Geroldine Berg, MD 07/30/23 [phone]

## 2023-07-30 NOTE — Discharge Instructions (Signed)
 Leave dressing in place until followed up by hand surgery.  Begin taking Keflex  as prescribed.  Begin taking Percocet as prescribed as needed for pain.  You are to follow-up with hand surgery Wednesday morning.  The contact information for Dr. Alyse has been provided in this discharge summary for you to call and make these arrangements.  He is aware of your situation and wants you seen then.  Call his office this morning to make these arrangements.

## 2023-07-31 ENCOUNTER — Emergency Department (HOSPITAL_COMMUNITY)
Admission: EM | Admit: 2023-07-31 | Discharge: 2023-07-31 | Disposition: A | Discharge status: Home / Self Care | Attending: Emergency Medicine | Admitting: Emergency Medicine

## 2023-07-31 ENCOUNTER — Encounter (HOSPITAL_COMMUNITY): Payer: Self-pay

## 2023-07-31 ENCOUNTER — Other Ambulatory Visit: Payer: Self-pay

## 2023-07-31 DIAGNOSIS — S51812D Laceration without foreign body of left forearm, subsequent encounter: Secondary | ICD-10-CM | POA: Diagnosis not present

## 2023-07-31 DIAGNOSIS — W25XXXD Contact with sharp glass, subsequent encounter: Secondary | ICD-10-CM | POA: Insufficient documentation

## 2023-07-31 NOTE — Discharge Instructions (Signed)
 It was a pleasure taking part in your care.  As we discussed, please call Dr. Galvin office in the morning.  They have advised as they will see regardless of insurance status.  Please take the medication as prescribed.  Please take antibiotics as prescribed.  Keep wound covered until seen.  Return to ED with any new or worsening symptoms.

## 2023-07-31 NOTE — ED Triage Notes (Signed)
 Pt arrived from home via POV c/o stitches popping  from laceration LUE that was stitched last night. Pt arm noted to be very swollen in that area. Pt states that the area below laceration has now become numb. Pt noted to have weak radial pulse.

## 2023-07-31 NOTE — ED Provider Triage Note (Signed)
 Emergency Medicine Provider Triage Evaluation Note  Jill Collier , a 43 y.o. female  was evaluated in triage.  Pt complains of left arm pain.  Patient was sutured last night in the emergency room after punching a window.  Was post to follow-up with hand surgery today however reports that they could not see here because of lack of appointment availability and lack of insurance and were requesting a co-pay.  Patient reports that she started to have some swelling as well as some numbness just distal to the wound and was concerned to report to the ER..  Review of Systems  Positive:  Negative:   Physical Exam  BP (!) 143/112 (BP Location: Right Arm)   Pulse 97   Temp 98.9 F (37.2 C)   Resp 16   Ht 5' 3 (1.6 m)   Wt 90.7 kg   LMP 06/20/2023 (Exact Date)   SpO2 98%   BMI 35.43 kg/m  Gen:   Awake, no distress   Resp:  Normal effort  MSK:   Moves extremities without difficulty  Other:  Diminished, but palpable pulses on the left radial.  Could be secondary to swelling.  She does have some firm  swelling but still pliable.  Does appear uncomfortable but not any acute distress.She is still good make a fist and has grip strength but does have pain with doing so.  She does have some numbness just distal to the incision however has sensation intact and can wiggle all fingers in the palm and dorsum of the hand.  Brisk cap refill. Medical Decision Making  Medically screening exam initiated at 10:00 PM.  Appropriate orders placed.  Jill Collier was informed that the remainder of the evaluation will be completed by another provider, this initial triage assessment does not replace that evaluation, and the importance of remaining in the ED until their evaluation is complete.  10:00 PM hand surgery consultation was made  10:09 PM Spoke with Dr. Shari. He is not concerned for compartment syndrome and does not feel the need to come into the ER. He is recommending removing every other suture if there is  concern with the swelling and assures me that him or Alyse will see her tomorrow in the office regardless of appointment availability or ability to pay and will communicate this with his staff.Jill Bernis Ernst, PA-C 07/31/23 2248

## 2023-07-31 NOTE — ED Provider Notes (Signed)
 [place] EMERGENCY DEPARTMENT AT [facility] Provider Note   CSN: [id number] Arrival date & time: 07/31/23  1911     Patient presents with: Wound Dehiscence   Jill Collier is a 43 y.o. female who returns to the ED after being seen last night for a laceration to left forearm.  The patient apparently struck her face into a glass window causing severe laceration to her volar aspect of left forearm.  She was seen last night, had 24 sutures placed and was advised to follow-up this morning with hand surgery, Dr. Alyse.  Apparently, the patient called Dr. Galvin office and was advised that she will need co-pay as well as insurance to be seen.  Patient returns tonight due to having numbness distally to her laceration.  Patient was seen in triage and triage provider called Dr. Shari who advised that patient would be seen in clinic tomorrow morning.  Dr. Shari has requested that we remove every other suture from wound.  Patient reports that she has pain medication at home as well as Keflex  antibiotic.  She denies any numbness or tingling of her left forearm.  Denies any fevers, nausea or vomiting at home.   HPI     Prior to Admission medications   Medication Sig Start Date End Date Taking? Authorizing Provider  azithromycin  (ZITHROMAX ) 250 MG tablet Take 1 tablet (250 mg total) by mouth daily. 11/17/21   Towana Ozell BROCKS, MD  cephALEXin  (KEFLEX ) 500 MG capsule Take 1 capsule (500 mg total) by mouth 4 (four) times daily. 07/30/23   Geroldine Berg, MD  dextromethorphan-guaiFENesin (MUCINEX DM) 30-600 MG 12hr tablet Take 1 tablet by mouth 2 (two) times daily.    [provider]  nicotine  (NICODERM CQ  - DOSED IN MG/24 HOURS) 21 mg/24hr patch Place 1 patch (21 mg total) onto the skin daily. 11/17/21   Towana Ozell BROCKS, MD  oxyCODONE -acetaminophen  (PERCOCET/ROXICET) 5-325 MG tablet Take 1-2 tablets by mouth every 6 (six) hours as needed for severe pain (pain score 7-10). 07/30/23    Geroldine Berg, MD  predniSONE  (DELTASONE ) 20 MG tablet Take 3 tablets (60 mg total) by mouth daily. 11/17/21   Butler, Michael C, MD    Allergies: Patient has no known allergies.    Review of Systems  Skin:  Positive for wound.  All other systems reviewed and are negative.   Updated Vital Signs BP 129/81 (BP Location: Right Arm)   Pulse 77   Temp 98.6 F (37 C) (Oral)   Resp 17   Ht 5' 3 (1.6 m)   Wt 90.7 kg   LMP 06/20/2023 (Exact Date)   SpO2 100%   BMI 35.43 kg/m   Physical Exam Vitals and nursing note reviewed.  Constitutional:      General: She is not in acute distress.    Appearance: She is well-developed.  HENT:     Head: Normocephalic and atraumatic.  Eyes:     Conjunctiva/sclera: Conjunctivae normal.  Cardiovascular:     Rate and Rhythm: Normal rate and regular rhythm.     Heart sounds: No murmur heard. Pulmonary:     Effort: Pulmonary effort is normal. No respiratory distress.     Breath sounds: Normal breath sounds.  Abdominal:     Palpations: Abdomen is soft.     Tenderness: There is no abdominal tenderness.  Musculoskeletal:        General: No swelling.     Cervical back: Neck supple.  Skin:    General:  Skin is warm and dry.     Capillary Refill: Capillary refill takes less than 2 seconds.     Comments: 12 cm laceration to volar aspect of left forearm with 12 sutures in place.  No wound dehiscence.  Soft compressible compartments.  1+ radial pulse noted.  Neurological:     Mental Status: She is alert.  Psychiatric:        Mood and Affect: Mood normal.     (all labs ordered are listed, but only abnormal results are displayed) Labs Reviewed - No data to display  EKG: None  Radiology: No results found.   Procedures   Medications Ordered in the ED - No data to display   Medical Decision Making  43 year old female returns to ED for left forearm laceration.  Patient was seen last night and had 20 4 sutures placed to the left forearm  laceration to the volar aspect.  She returns stating she has numbness distally to the injury.  Reports that she attempted to follow-up today with hand surgery but was advised she was unable to do so due to insurance status.  Triage provider spoke with Dr. Ahmad of hand surgery who advises that patient should have every other suture removed and she will be seen in clinic tomorrow morning.  Triage provider did state that she was concerned for compartment syndrome however on my examination, no signs of compartment syndrome.  Dr. Shari does not feel as if patient has compartment syndrome based on discussion they had.  Patient does have 1+ radial pulse.  No numbness or tingling.  Patient had every other suture removed.  She reports that she has pain medication at home as well as antibiotics.  She was discharged in stable condition and advised to follow-up with Dr. Alyse office in the morning.  She voiced understanding.  Stable to discharge    Final diagnoses:  Forearm laceration, left, subsequent encounter    ED Discharge Orders     None          Jill Collier Jill Collier 07/31/23 2319    Raford Lenis, MD 08/01/23 [phone]

## 2023-08-01 DIAGNOSIS — S61213A Laceration without foreign body of left middle finger without damage to nail, initial encounter: Secondary | ICD-10-CM | POA: Diagnosis not present

## 2023-08-01 DIAGNOSIS — S51812A Laceration without foreign body of left forearm, initial encounter: Secondary | ICD-10-CM | POA: Diagnosis not present

## 2023-08-02 DIAGNOSIS — S66123A Laceration of flexor muscle, fascia and tendon of left middle finger at wrist and hand level, initial encounter: Secondary | ICD-10-CM | POA: Diagnosis not present

## 2023-08-02 DIAGNOSIS — X58XXXA Exposure to other specified factors, initial encounter: Secondary | ICD-10-CM | POA: Diagnosis not present

## 2023-08-02 DIAGNOSIS — S56221A Laceration of other flexor muscle, fascia and tendon at forearm level, right arm, initial encounter: Secondary | ICD-10-CM | POA: Diagnosis not present

## 2023-08-02 DIAGNOSIS — S55112A Laceration of radial artery at forearm level, left arm, initial encounter: Secondary | ICD-10-CM | POA: Diagnosis not present

## 2023-08-02 DIAGNOSIS — Y999 Unspecified external cause status: Secondary | ICD-10-CM | POA: Diagnosis not present

## 2023-08-02 DIAGNOSIS — S66121A Laceration of flexor muscle, fascia and tendon of left index finger at wrist and hand level, initial encounter: Secondary | ICD-10-CM | POA: Diagnosis not present

## 2023-08-02 DIAGNOSIS — S66127A Laceration of flexor muscle, fascia and tendon of left little finger at wrist and hand level, initial encounter: Secondary | ICD-10-CM | POA: Diagnosis not present

## 2023-08-02 DIAGNOSIS — S51812A Laceration without foreign body of left forearm, initial encounter: Secondary | ICD-10-CM | POA: Diagnosis not present

## 2023-08-02 DIAGNOSIS — S66125A Laceration of flexor muscle, fascia and tendon of left ring finger at wrist and hand level, initial encounter: Secondary | ICD-10-CM | POA: Diagnosis not present

## 2023-08-02 DIAGNOSIS — S51822A Laceration with foreign body of left forearm, initial encounter: Secondary | ICD-10-CM | POA: Diagnosis not present

## 2023-08-27 DIAGNOSIS — Z419 Encounter for procedure for purposes other than remedying health state, unspecified: Secondary | ICD-10-CM | POA: Diagnosis not present

## 2023-09-27 DIAGNOSIS — Z419 Encounter for procedure for purposes other than remedying health state, unspecified: Secondary | ICD-10-CM | POA: Diagnosis not present

## 2023-11-27 DIAGNOSIS — Z419 Encounter for procedure for purposes other than remedying health state, unspecified: Secondary | ICD-10-CM | POA: Diagnosis not present
